# Patient Record
Sex: Female | Born: 1980 | Race: White | Hispanic: No | Marital: Married | Smoking: Former smoker
Health system: Southern US, Community
[De-identification: ages and names within clinical notes are randomized; demographics above are authoritative.]

## PROBLEM LIST (undated history)

## (undated) DIAGNOSIS — K219 Gastro-esophageal reflux disease without esophagitis: Secondary | ICD-10-CM

## (undated) DIAGNOSIS — R51 Headache: Secondary | ICD-10-CM

## (undated) DIAGNOSIS — F329 Major depressive disorder, single episode, unspecified: Secondary | ICD-10-CM

## (undated) DIAGNOSIS — E0590 Thyrotoxicosis, unspecified without thyrotoxic crisis or storm: Secondary | ICD-10-CM

## (undated) DIAGNOSIS — F419 Anxiety disorder, unspecified: Secondary | ICD-10-CM

## (undated) DIAGNOSIS — F32A Depression, unspecified: Secondary | ICD-10-CM

## (undated) HISTORY — DX: Thyrotoxicosis, unspecified without thyrotoxic crisis or storm: E05.90

## (undated) HISTORY — DX: Anxiety disorder, unspecified: F41.9

## (undated) HISTORY — PX: WISDOM TOOTH EXTRACTION: SHX21

## (undated) HISTORY — DX: Depression, unspecified: F32.A

## (undated) HISTORY — PX: TUBAL LIGATION: SHX77

## (undated) HISTORY — DX: Major depressive disorder, single episode, unspecified: F32.9

---
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
## 2003-06-21 ENCOUNTER — Other Ambulatory Visit: Admission: RE | Admit: 2003-06-21 | Discharge: 2003-06-21 | Payer: Self-pay | Admitting: Obstetrics and Gynecology

## 2003-11-05 ENCOUNTER — Emergency Department (HOSPITAL_COMMUNITY): Admission: EM | Admit: 2003-11-05 | Discharge: 2003-11-06 | Payer: Self-pay | Admitting: Emergency Medicine

## 2004-08-07 ENCOUNTER — Emergency Department (HOSPITAL_COMMUNITY): Admission: EM | Admit: 2004-08-07 | Discharge: 2004-08-07 | Payer: Self-pay | Admitting: Emergency Medicine

## 2006-11-06 ENCOUNTER — Inpatient Hospital Stay (HOSPITAL_COMMUNITY): Admission: AD | Admit: 2006-11-06 | Discharge: 2006-11-06 | Payer: Self-pay | Admitting: Obstetrics and Gynecology

## 2006-11-08 ENCOUNTER — Inpatient Hospital Stay (HOSPITAL_COMMUNITY): Admission: AD | Admit: 2006-11-08 | Discharge: 2006-11-08 | Payer: Self-pay | Admitting: Obstetrics and Gynecology

## 2006-11-13 ENCOUNTER — Inpatient Hospital Stay (HOSPITAL_COMMUNITY): Admission: AD | Admit: 2006-11-13 | Discharge: 2006-11-18 | Payer: Self-pay | Admitting: Obstetrics and Gynecology

## 2006-11-15 ENCOUNTER — Encounter (INDEPENDENT_AMBULATORY_CARE_PROVIDER_SITE_OTHER): Payer: Self-pay | Admitting: Obstetrics and Gynecology

## 2006-12-25 ENCOUNTER — Inpatient Hospital Stay (HOSPITAL_COMMUNITY): Admission: AD | Admit: 2006-12-25 | Discharge: 2006-12-25 | Payer: Self-pay | Admitting: Obstetrics and Gynecology

## 2007-02-19 ENCOUNTER — Emergency Department (HOSPITAL_COMMUNITY): Admission: EM | Admit: 2007-02-19 | Discharge: 2007-02-19 | Payer: Self-pay | Admitting: Emergency Medicine

## 2007-09-20 ENCOUNTER — Emergency Department (HOSPITAL_COMMUNITY): Admission: EM | Admit: 2007-09-20 | Discharge: 2007-09-20 | Payer: Self-pay | Admitting: Family Medicine

## 2007-10-15 ENCOUNTER — Emergency Department (HOSPITAL_COMMUNITY): Admission: EM | Admit: 2007-10-15 | Discharge: 2007-10-15 | Payer: Self-pay | Admitting: Emergency Medicine

## 2007-11-05 ENCOUNTER — Emergency Department (HOSPITAL_COMMUNITY): Admission: EM | Admit: 2007-11-05 | Discharge: 2007-11-06 | Payer: Self-pay | Admitting: Emergency Medicine

## 2010-06-04 NOTE — Discharge Summary (Signed)
Christina Riley, Christina Riley         ACCOUNT NO.:  [id number]   MEDICAL RECORD NO.:  [id number]          PATIENT TYPE:  INP   LOCATION:  9131                          FACILITY:  [facility]   PHYSICIAN:  Naima A. Dillard, M.D. DATE OF BIRTH:  [birth year]   DATE OF ADMISSION:  11/13/2006  DATE OF DISCHARGE:  11/18/2006                               DISCHARGE SUMMARY   ADMISSION DIAGNOSES:  1. Intrauterine pregnancy at term.  2. Gestational hypertension with no preeclampsia.  3. Unfavorable cervix.  4. Group B strep positive.  5. Cystic fibrosis carrier.   DISCHARGE DIAGNOSES:  1. Intrauterine pregnancy at 40-2/7 weeks.  2. Pregnancy-induced hypertension.  3. Failure to progress.   PROCEDURES:  1. Primary low transverse Cesarean section.  2. Epidural anesthesia.   HOSPITAL COURSE:  Christina Riley is a 30 year old, gravida 1, para 0 at  40 weeks who presented for evaluation of blood pressure on November 13, 2006. She had been having some elevated blood pressures but had a  negative PIH evaluation the week before. Her pregnancy had been  remarkable for:  1. First trimester spotting.  2. Toxoplasmosis risk.  3. Questionable last menstrual period.  4. First trimester urinary tract infection.  5. First trimester chlamydia.  6. Cystic fibrosis carrier.  7. Single umbilical artery.  8. Group B strep positive.  9. Gestational hypertension.   Her blood pressure in the office on October 24 was 150/100. She had some  protein in her clean-catch urine and was sent to maternity admissions  for evaluation. She had no protein on a catheter urine. LFTs were within  normal limits, and PIH labs were normal. Blood pressures were remaining  138/88 and 129/82. The patient was given the option of admission and  induction of labor. The other option was to go home on bedrest and  follow up in 48 hours. The patient and her mother decided upon  induction. Cervidil was placed on the evening of October 24.  She  received some Percocet during the night for back pain. By the morning of  October 25, cervix was 1, 50%, -2. Pitocin was begun. It was run  throughout the evening and was discontinued late in the afternoon with  no cervical change. She had some vaginal bleeding during the night. She  had some Cytotec. Cervix was 2, 60%, vertex -2. By the morning of  October 26, her cervix was 1 to 2, 70%, vertex at a -1 station. Uterine  contractions were every 2 to 5 minutes. They were more painful. Epidural  was placed. Artificial rupture of membranes was accomplished that  morning with clear fluid noted. IUPC was placed. By 5:00 in the  afternoon, she was still 2 cm, vertex not well applied. [place] had  been adequate. There was some occasional subtle decelerations.  Temperature was 99.6. Pitocin had to be stopped due to late  decelerations, and it was restarted with an overall reassuring tracing.  However, There was concern for having to increase the Pitocin and create  more decelerations. Decision was made to proceed with cesarean section.  The patient was taken to the operating room where  a primary low  transverse Cesarean section was performed by Dr. Estanislado Pandy. Findings were a  viable female by the name Christina Riley. Apgars were 6 and 9. Weight was 7  pounds. Infant was taken to the full-term nursery. Mother was taken to  recovery in good condition. By postoperative day #1, the patient was  doing well. She was up ad lib. Hemoglobin was 12.1, hematocrit 34.4,  white blood cell count 10.8 and platelet count of 224. The patient was  tolerating pain with p.o. pain medication without difficulty and a  regular diet. The rest of her hospital stay was uncomplicated. By  postoperative day #3, she was doing well. She was tolerated a regular  diet. She was working with a lactation consultation secondary to semi-  flat nipples and was still working on breast feeding. She was also doing  some pumping. She was  undecided regarding birth control and will decide  by six weeks. She was deemed to have received full benefit of hospital  setting and discharged home.   DISCHARGE INSTRUCTIONS:  Per [facility] handout.   DISCHARGE MEDICATIONS:  1. Motrin 600 mg p.o. q.6h. p.r.n. pain.  2. Percocet 1-2 p.o. q.3-4h. p.r.n. pain.   DISCHARGE FOLLOWUP:  Will occur in 6 weeks at [facility].      Christina Riley, C.N.M.      Naima A. Normand Sloop, M.D.  Electronically Signed    VLL/MEDQ  D:  11/18/2006  T:  11/18/2006  Job:  [id number]

## 2010-06-04 NOTE — Op Note (Signed)
Christina Riley, Christina Riley         ACCOUNT NO.:  [id number]   MEDICAL RECORD NO.:  [id number]          PATIENT TYPE:  INP   LOCATION:  9131                          [facility]ILITY[facility]  [facility]   PHYSICIAN:  Crist Fat. Rivard, M.D. DATE OF BIRTH:  05-23-1980   DATE OF PROCEDURE:  11/15/2006  DATE OF DISCHARGE:                               OPERATIVE REPORT   PREOPERATIVE DIAGNOSES:  1. Intrauterine pregnancy at 40 weeks and 2 days.  2. Pregnancy-induced hypertension.  3. Failure to progress.   POSTOPERATIVE DIAGNOSES:  1. Intrauterine pregnancy at 40 weeks and 2 days.  2. Pregnancy-induced hypertension.  3. Failure to progress.   ANESTHESIA:  Epidural, Dr. Jean Rosenthal.   PROCEDURE:  Primary low transverse cesarean section.   SURGEON:  Dr. Estanislado Pandy.   ASSISTANT:  Nigel Bridgeman, certified nurse midwife.   ESTIMATED BLOOD LOSS:  600 mL.   DESCRIPTION OF PROCEDURE:  After being informed of the planned procedure  with possible complications including bleeding, infection, injury to  other organs, informed consent was obtained.  The patient is taken to OR  #1 and preexisting epidural is reinforced.  She is placed in the dorsal  decubitus position, pelvis tilted to the left, prepped and draped in a  sterile fashion with a Foley catheter in her bladder.   After assessing adequate level of anesthesia, we infiltrate the  suprapubic area with 20 mL of Marcaine 0.25 and perform a Pfannenstiel  incision which was brought down sharply to the fascia.  The fascia is  incised in a low transverse fashion.  The linea alba is dissected,  peritoneum is entered in a midline fashion.  Alexis retractor is  inserted, visceral peritoneum is entered in a low transverse fashion  allowing [place] to safely retract bladder by developing a bladder flap.  We  can now enter the myometrium in a low transverse fashion first with  knife and then extended bluntly.  Amniotic fluid is thick meconium and  we assist the birth of a female  infant at 75, mouth and nose are  suctioned with DeLee suction, baby is delivered, cord is clamped with  two Kelly clamps and the baby is given to Dr. Joana Reamer present in the  room.  2 grams of Ancef IV are given to the patient and we wait for the  spontaneous delivery of the placenta.  It is complete, cord has three  vessels.  Uterine revision is negative.   We then proceed with closure of the myometrium in two layers, first with  a running locked suture of #0 Vicryl and then with a Lembert suture of  #0 Vicryl imbricating the first one.  Hemostasis was then completed on  the peritoneal edges with cauterization.  Both paracolic gutters are  cleaned, both tubes and ovaries assessed and normal and the pelvis is  then profusely irrigated with warm saline.  Hemostasis is deemed  adequate.   The retractors are removed, under fascia hemostasis is completed with  cautery and the fascia is closed with two running sutures of #1 Vicryl  meeting midline.  The wound is irrigated with warm saline.  Hemostasis  is completed  with cautery and the skin is closed with a subcuticular  suture of 3-0 Monocryl and Steri-Strips.   Instrument and sponge count is complete x2.  Estimated blood loss is 600  mL. The procedure is well tolerated by the patient who was taken to the  recovery room in a well and stable condition.   A little boy named Sheria Lang was born at 6:09 p.m., received an Apgar of 6  at 1 minute, 9 at 5 minutes and weighed 7 pounds.   SPECIMEN:  Placenta sent to pathology.      Crist Fat Rivard, M.D.  Electronically Signed     SAR/MEDQ  D:  11/15/2006  T:  11/16/2006  Job:  [id number]

## 2010-06-04 NOTE — H&P (Signed)
Riley, Christina Riley         ACCOUNT NO.:  [id number]   MEDICAL RECORD NO.:  [id number]          PATIENT TYPE:  INP   LOCATION:  9172                          [facility]   PHYSICIAN:  Crist Fat. Rivard, M.D. DATE OF BIRTH:  1980/08/01   DATE OF ADMISSION:  11/13/2006  DATE OF DISCHARGE:                              HISTORY & PHYSICAL   [name] is a 30 year old single white female, primigravida at 75-  0/7 weeks today, who presents from the office for evaluation of elevated  blood pressures.  She had a negative PIH evaluation last week, including  a 24 hour urine.  She denies signs and symptoms of PIH now.  She denies  bleeding or leaking.  Her pregnancy has been followed by the [facility] Certified Nurse Midwife [facility] and has been remarkable  for:  (1) First trimester spotting.  (2) Toxoplasmosis risk.  (3)  Questionable last menstrual period.  (4) First trimester UTI.  (5) First  trimester Chlamydia.  (6) Cystic fibrosis carrier.  (7) Single umbilical  artery.  (8) Group B strep positive.   Her prenatal labs were collected on April 08, 2006.  Hemoglobin 14.6,  hematocrit 42, platelets 294,000, blood type A positive, antibody  negative, toxoplasmosis labs negative, RPR nonreactive, rubella immune,  hepatitis B surface antigen negative, HIV nonreactive.  Pap smear from  December 2007 was within normal limits, gonorrhea negative, Chlamydia  positive, cystic fibrosis positive, and urine culture positive for E.  coli.  First trimester screen from May 06, 2006, was normal.  One hour  Glucola from August 10, 2006, was 102.  Culture in the vaginal tract for  group B strep on October 20, 2006, was positive.   HISTORY OF PRESENT PREGNANCY:  The patient presented for care at [facility] on April 08, 2006, at 8-5/7 weeks' gestation.  Ultrasonography  was done at that first visit, establishing best EDC to be November 13, 2006.  There was an anterior  previa noted at that visit.  She was also  treated for a UTI at that visit as well.  The patient had a normal first  trimester screen at 12-1/2 weeks' gestation.  Previa was still present  at that visit.  Chlamydia test of cure was negative at 18 weeks'  gestation.  She also had a negative urine culture at that visit as well.  Anatomy ultrasound at 20 weeks shows growth consistent with previous  dating.  There was single umbilical artery documented.  Previa had  resolved.  There was normal fluid.  She had an AFP that was within  normal limits.  She had a normal 1 hour Glucola.  Ultrasound for growth  at 28 weeks showed growth consistent with the previous dating, normal  fluid.  Growth ultrasound at 37 weeks was done due to single umbilical  artery.  Estimated fetal weight was at the 68th percentile with normal  fluid.  At her 39 week visit, blood pressure was elevated.  PIH workup  was within normal limits.  A 24-hour urine was normal.  At her visit in  the office today, blood  pressure was elevated to 150/100, and she had  some protein in her clean-catch urinalysis and was sent to maternity  admissions for evaluation.   OBSTETRICAL HISTORY:  She is a primigravida.   PAST MEDICAL HISTORY:  1. She has no medication allergies.  2. She experienced menarche at the age of 30 with 30 day cycles      lasting 3-4 days.  3. She reports having had the usual childhood illnesses.  4. She had a UTI at the age of 76.   SURGICAL HISTORY:  Negative.   FAMILY MEDICAL HISTORY:  Multiple family members with chronic  hypertension, maternal grandmother with diabetes, maternal grandmother  also with thyroid dysfunction.   GENETIC HISTORY:  The father of the baby's first cousin is expecting  twins.  Otherwise it is negative.   SOCIAL HISTORY:  The patient is single.  The father of the baby is  involved.  His name is Jeannett Senior.  They are both high school educated.  The patient is part-time employed at [employer].  Father of the  baby has a full time job.  They deny any alcohol, tobacco, or illicit  drug use since positive pregnancy test.   OBJECTIVE DATA:  Blood pressure is 138/88, 129/82.  Other vital signs  are stable.  She is afebrile.  HEENT:  Grossly within normal limits.  CHEST:  Clear to auscultation.  HEART:  Regular rate and rhythm.  ABDOMEN:  Gravid in contour with fundal height extending approximately  40 cm above the pubic symphysis.  NFT is reactive, mild contractions per  toco.  Cervix was fingertip, 50%, vertex -2 in the office today.  EXTREMITIES:  Remarkable for 1+ edema, 2+ DTRs.   Cath urinalysis has 100 of glucose and no protein, hemoglobin 14.4,  hematocrit 41.5.  Liver functions are within normal limits.  Platelets  288,000, LDH 76, and uric acid 3.8.   ASSESSMENT:  1. Intrauterine pregnancy at term.  2. Gestational hypertension with no preeclampsia.  3. Unfavorable cervix.   PLAN:  Options were reviewed with the patient.  Admission and induction  of labor were recommended with increased risk of cesarean section, fetal  distress, and a long induction process are reviewed with the patient.  It was also offered for her to go home on bed rest with followup in 48  hours.  The patient and her mother decided upon admission and induction.  We will plan on Cervidil overnight and then further plan to be  determined by cervical evaluation in the morning.      Cam Hai, C.N.M.      Crist Fat Rivard, M.D.  Electronically Signed    KS/MEDQ  D:  11/13/2006  T:  11/14/2006  Job:  [id number]

## 2010-10-10 LAB — CBC
Hemoglobin: 14.4
Platelets: 331
RDW: 13.3
WBC: 5.4

## 2010-10-10 LAB — COMPREHENSIVE METABOLIC PANEL
ALT: 27
AST: 19
Albumin: 4.2
Calcium: 9.1
Chloride: 107
Creatinine, Ser: 0.78
GFR calc Af Amer: >60
Glucose, Bld: 110 — ABNORMAL HIGH
Potassium: 4.3
Sodium: 141
Total Bilirubin: 0.6
Total Protein: 7.2

## 2010-10-10 LAB — RAPID URINE DRUG SCREEN, HOSP PERFORMED
Cocaine: POSITIVE — AB
Opiates: NOT DETECTED

## 2010-10-10 LAB — URINALYSIS, ROUTINE W REFLEX MICROSCOPIC
Hgb urine dipstick: NEGATIVE
Nitrite: NEGATIVE
pH: 6

## 2010-10-10 LAB — DIFFERENTIAL
Basophils Relative: 0
Eosinophils Absolute: 0
Neutro Abs: 2.8
Neutrophils Relative %: 51

## 2010-10-10 LAB — PREGNANCY, URINE: Preg Test, Ur: NEGATIVE

## 2010-10-21 LAB — URINE MICROSCOPIC-ADD ON

## 2010-10-21 LAB — URINALYSIS, ROUTINE W REFLEX MICROSCOPIC
Glucose, UA: NEGATIVE
Ketones, ur: NEGATIVE
Nitrite: POSITIVE — AB
Specific Gravity, Urine: 1.018
Urobilinogen, UA: 0.2

## 2010-10-21 LAB — DIFFERENTIAL
Basophils Absolute: 0
Basophils Relative: 0
Eosinophils Absolute: 0.1
Lymphocytes Relative: 40
Neutrophils Relative %: 50

## 2010-10-21 LAB — CBC
Hemoglobin: 12.4
MCHC: 33.6
MCV: 85.5
Platelets: 257
RBC: 4.3
WBC: 4.1

## 2010-10-21 LAB — POCT I-STAT, CHEM 8
BUN: 11
Creatinine, Ser: 0.6
Glucose, Bld: 82
TCO2: 25

## 2010-10-22 LAB — URINALYSIS, ROUTINE W REFLEX MICROSCOPIC
Bilirubin Urine: NEGATIVE
Glucose, UA: NEGATIVE
Protein, ur: 30 — AB
pH: 5.5

## 2010-10-22 LAB — POCT I-STAT, CHEM 8
BUN: 21
Chloride: 109
Creatinine, Ser: 0.5
Glucose, Bld: 91
HCT: 45
Hemoglobin: 15.3 — ABNORMAL HIGH
Potassium: 4.3
Sodium: 140

## 2010-10-22 LAB — POCT PREGNANCY, URINE: Preg Test, Ur: NEGATIVE

## 2010-10-22 LAB — URINE MICROSCOPIC-ADD ON

## 2010-10-28 LAB — CBC
MCHC: 35.4
MCV: 87.9
Platelets: 233
RBC: 4.3

## 2010-10-28 LAB — LACTATE DEHYDROGENASE: LDH: 83 — ABNORMAL LOW

## 2010-10-28 LAB — URINE MICROSCOPIC-ADD ON

## 2010-10-28 LAB — COMPREHENSIVE METABOLIC PANEL
AST: 33
Alkaline Phosphatase: 61
CO2: 25
GFR calc Af Amer: >60
Glucose, Bld: 99
Sodium: 140
Total Bilirubin: 0.6

## 2010-10-28 LAB — URINALYSIS, ROUTINE W REFLEX MICROSCOPIC
Ketones, ur: NEGATIVE
pH: 6

## 2010-10-28 LAB — URIC ACID: Uric Acid, Serum: 4.3

## 2010-10-30 LAB — URINALYSIS, ROUTINE W REFLEX MICROSCOPIC
Bilirubin Urine: NEGATIVE
Bilirubin Urine: NEGATIVE
Hgb urine dipstick: NEGATIVE
Ketones, ur: NEGATIVE
Nitrite: NEGATIVE
Protein, ur: NEGATIVE
Specific Gravity, Urine: 1.025
Urobilinogen, UA: 0.2
pH: 6

## 2010-10-30 LAB — COMPREHENSIVE METABOLIC PANEL
Albumin: 2.9 — ABNORMAL LOW
Albumin: 3 — ABNORMAL LOW
Alkaline Phosphatase: 163 — ABNORMAL HIGH
BUN: 10
BUN: 7
CO2: 22
Calcium: 9.5
Chloride: 108
GFR calc non Af Amer: >60
Glucose, Bld: 105 — ABNORMAL HIGH
Glucose, Bld: 90
Potassium: 4.2
Sodium: 136
Total Bilirubin: 0.5
Total Protein: 6.4

## 2010-10-30 LAB — CBC
HCT: 39
HCT: 41.5
Hemoglobin: 13.6
Hemoglobin: 13.6
Hemoglobin: 14.4
MCHC: 34.9
MCHC: 35.1
MCHC: 35.3
MCV: 90.2
MCV: 90.3
Platelets: 224
Platelets: 285
RBC: 4.27
RBC: 4.55
RDW: 13.8
WBC: 10.8 — ABNORMAL HIGH
WBC: 8.4

## 2010-10-30 LAB — LACTATE DEHYDROGENASE
LDH: 76 — ABNORMAL LOW
LDH: 96

## 2010-10-30 LAB — CREATININE CLEARANCE, URINE, 24 HOUR
Collection Interval-CRCL: 24
Creatinine, Urine: 51

## 2010-10-30 LAB — URIC ACID
Uric Acid, Serum: 3.7
Uric Acid, Serum: 3.8

## 2010-10-30 LAB — PROTEIN, URINE, 24 HOUR: Protein, 24H Urine: 113 — ABNORMAL HIGH

## 2011-01-21 NOTE — L&D Delivery Note (Signed)
Cesarean Section Procedure Note  Indications: Elective repeat C/S and BTL.  Pre-operative Diagnosis: 39 week 0 day pregnancy.  Post-operative Diagnosis: same  Surgeon: Coral Ceo A   Assistants: Antionette Char  Anesthesia: Spinal anesthesia  ASA Class: 2   Procedure Details   The patient was seen in the Holding Room. The risks, benefits, complications, treatment options, and expected outcomes were discussed with the patient.  The patient concurred with the proposed plan, giving informed consent.  The site of surgery properly noted/marked. The patient was taken to Operating Room # 9, identified as AMELA HANDLEY and the procedure verified as C-Section Delivery. A Time Out was held and the above information confirmed.  After induction of anesthesia, the patient was draped and prepped in the usual sterile manner. A Pfannenstiel incision was made and carried down through the subcutaneous tissue to the fascia. Fascial incision was made and extended transversely. The fascia was separated from the underlying rectus tissue superiorly and inferiorly. The peritoneum was identified and entered. Peritoneal incision was extended longitudinally. The utero-vesical peritoneal reflection was incised transversely and the bladder flap was bluntly freed from the lower uterine segment. A low transverse uterine incision was made. Delivered from cephalic presentation was a 2870 gram Female with Apgar scores of 9 at one minute and 9 at five minutes. After the umbilical cord was clamped and cut cord blood was obtained for evaluation. The placenta was removed intact and appeared normal. The uterine outline, tubes and ovaries appeared normal. The uterine incision was closed with running locked sutures of . Hemostasis was observed.  The left fallopian tube was then grasped with a Babcock clamp in isthmic portion and knuckle of tube beneath the clamp was suture ligated through the mesosalpinx.  The  knuckle of tube above the knot was then excised and submitted to pathology.  The tubal stumps were hemostatic and were placed back in their normal anatomic position.  The same procedure was performed on the opposite side without complications.  Lavage was carried out until clear. The fascia was then reapproximated with running sutures of 0 Vicryl. The skin was reapproximated with Staples.  Instrument, sponge, and needle counts were correct prior the abdominal closure and at the conclusion of the case.   Findings: Normal uterus, ovaries and tubes.  Estimated Blood Loss:  600         Drains: Foley         Total IV Fluids:          Specimens: Placenta          Implants: none         Complications:  None; patient tolerated the procedure well.         Disposition: PACU - hemodynamically stable.         Condition: stable  Attending Attestation: I was present and scrubbed for the entire procedure.

## 2011-04-30 LAB — OB RESULTS CONSOLE RPR: RPR: NONREACTIVE

## 2011-11-13 ENCOUNTER — Encounter (HOSPITAL_COMMUNITY): Payer: Self-pay | Admitting: Pharmacist

## 2011-11-13 ENCOUNTER — Other Ambulatory Visit: Payer: Self-pay | Admitting: Obstetrics

## 2011-11-13 ENCOUNTER — Encounter (HOSPITAL_COMMUNITY): Payer: Self-pay

## 2011-11-14 ENCOUNTER — Encounter (HOSPITAL_COMMUNITY): Payer: Self-pay

## 2011-11-14 ENCOUNTER — Encounter (HOSPITAL_COMMUNITY)
Admission: RE | Admit: 2011-11-14 | Discharge: 2011-11-14 | Disposition: A | Discharge status: Home / Self Care | Payer: Medicaid Other | Source: Ambulatory Visit | Attending: Obstetrics | Admitting: Obstetrics

## 2011-11-14 HISTORY — DX: Gastro-esophageal reflux disease without esophagitis: K21.9

## 2011-11-14 HISTORY — DX: Headache: R51

## 2011-11-14 LAB — RPR: RPR Ser Ql: NONREACTIVE

## 2011-11-14 LAB — CBC
Hemoglobin: 12.2 g/dL (ref 12.0–15.0)
MCHC: 33.5 g/dL (ref 30.0–36.0)
Platelets: 203 10*3/uL (ref 150–400)
RBC: 4.14 MIL/uL (ref 3.87–5.11)

## 2011-11-14 LAB — ABO/RH: ABO/RH(D): A POS

## 2011-11-14 LAB — TYPE AND SCREEN
ABO/RH(D): A POS
Antibody Screen: NEGATIVE

## 2011-11-14 LAB — SURGICAL PCR SCREEN: MRSA, PCR: NEGATIVE

## 2011-11-14 NOTE — Patient Instructions (Addendum)
   Your procedure is scheduled ZO:[id number] October 28th  Enter through the Main Entrance of [facility] at:6am Pick up the phone at the desk and dial [phone] and inform us of your arrival.  Please call this number if you have any problems the morning of surgery: [phone]  Do not eat or drink anything after midnight on Sunday Please take your protonix if needed with sips of water morning of surgery  Do not wear jewelry, make-up, or FINGER nail polish No metal in your hair or on your body. Do not wear lotions, powders, perfumes. You may wear deodorant.  Please use your CHG wash as directed prior to surgery.  Do not shave anywhere for at least 12 hours prior to first CHG shower.  Do not bring valuables to the hospital.   Leave suitcase in the car. After Surgery it may be brought to your room. For patients being admitted to the hospital, checkout time is 11:00am the day of discharge.

## 2011-11-17 ENCOUNTER — Inpatient Hospital Stay (HOSPITAL_COMMUNITY): Payer: Medicaid Other | Admitting: Anesthesiology

## 2011-11-17 ENCOUNTER — Encounter (HOSPITAL_COMMUNITY)
Admission: AD | Disposition: A | Discharge status: Home / Self Care | Payer: Self-pay | Source: Ambulatory Visit | Attending: Obstetrics

## 2011-11-17 ENCOUNTER — Encounter (HOSPITAL_COMMUNITY): Payer: Self-pay | Admitting: Anesthesiology

## 2011-11-17 ENCOUNTER — Encounter (HOSPITAL_COMMUNITY): Payer: Self-pay | Admitting: Obstetrics

## 2011-11-17 ENCOUNTER — Encounter (HOSPITAL_COMMUNITY): Payer: Self-pay

## 2011-11-17 ENCOUNTER — Inpatient Hospital Stay (HOSPITAL_COMMUNITY)
Admission: AD | Admit: 2011-11-17 | Discharge: 2011-11-19 | DRG: 766 | Disposition: A | Discharge status: Home / Self Care | Payer: Medicaid Other | Source: Ambulatory Visit | Attending: Obstetrics | Admitting: Obstetrics

## 2011-11-17 DIAGNOSIS — Z01812 Encounter for preprocedural laboratory examination: Secondary | ICD-10-CM

## 2011-11-17 DIAGNOSIS — Z98891 History of uterine scar from previous surgery: Secondary | ICD-10-CM

## 2011-11-17 DIAGNOSIS — Z302 Encounter for sterilization: Secondary | ICD-10-CM

## 2011-11-17 DIAGNOSIS — O34219 Maternal care for unspecified type scar from previous cesarean delivery: Principal | ICD-10-CM | POA: Diagnosis present

## 2011-11-17 DIAGNOSIS — Z01818 Encounter for other preprocedural examination: Secondary | ICD-10-CM

## 2011-11-17 LAB — PREPARE RBC (CROSSMATCH)

## 2011-11-17 SURGERY — Surgical Case
Anesthesia: Spinal | Site: Abdomen | Laterality: Bilateral | Wound class: Clean Contaminated

## 2011-11-17 MED ORDER — KETOROLAC TROMETHAMINE 30 MG/ML IJ SOLN: 30.0000 mg | Freq: Four times a day (QID) | INTRAMUSCULAR | Status: DC | PRN

## 2011-11-17 MED ORDER — OXYCODONE-ACETAMINOPHEN 5-325 MG PO TABS
1.0000 | ORAL_TABLET | ORAL | Status: DC | PRN
  Administered 2011-11-17 – 2011-11-19 (×9): 2 via ORAL
  Filled 2011-11-17 (×9): qty 2

## 2011-11-17 MED ORDER — SCOPOLAMINE 1 MG/3DAYS TD PT72
1.0000 | MEDICATED_PATCH | TRANSDERMAL | Status: DC
  Administered 2011-11-17: 1.5 mg via TRANSDERMAL

## 2011-11-17 MED ORDER — FENTANYL CITRATE 0.05 MG/ML IJ SOLN
INTRAMUSCULAR | Status: AC
  Filled 2011-11-17: qty 2

## 2011-11-17 MED ORDER — DIBUCAINE 1 % RE OINT: 1.0000 "application " | TOPICAL_OINTMENT | RECTAL | Status: DC | PRN

## 2011-11-17 MED ORDER — LACTATED RINGERS IV SOLN
INTRAVENOUS | Status: DC
Start: 2011-11-17 — End: 2011-11-19
  Administered 2011-11-17 (×2): via INTRAVENOUS

## 2011-11-17 MED ORDER — SIMETHICONE 80 MG PO CHEW
80.0000 mg | CHEWABLE_TABLET | Freq: Three times a day (TID) | ORAL | Status: DC
  Administered 2011-11-17 – 2011-11-18 (×6): 80 mg via ORAL

## 2011-11-17 MED ORDER — OXYTOCIN 40 UNITS IN LACTATED RINGERS INFUSION - SIMPLE MED
INTRAVENOUS | Status: DC | PRN
  Administered 2011-11-17: 40 [IU] via INTRAVENOUS

## 2011-11-17 MED ORDER — CEFAZOLIN SODIUM-DEXTROSE 2-3 GM-% IV SOLR
2.0000 g | INTRAVENOUS | Status: AC
  Administered 2011-11-17: 2 g via INTRAVENOUS

## 2011-11-17 MED ORDER — SCOPOLAMINE 1 MG/3DAYS TD PT72
1.0000 | MEDICATED_PATCH | Freq: Once | TRANSDERMAL | Status: DC
  Filled 2011-11-17: qty 1

## 2011-11-17 MED ORDER — SODIUM CHLORIDE 0.9 % IJ SOLN: 3.0000 mL | INTRAMUSCULAR | Status: DC | PRN

## 2011-11-17 MED ORDER — ZOLPIDEM TARTRATE 5 MG PO TABS: 5.0000 mg | ORAL_TABLET | Freq: Every evening | ORAL | Status: DC | PRN

## 2011-11-17 MED ORDER — SIMETHICONE 80 MG PO CHEW: 80.0000 mg | CHEWABLE_TABLET | ORAL | Status: DC | PRN

## 2011-11-17 MED ORDER — ACETAMINOPHEN 500 MG PO TABS
1000.0000 mg | ORAL_TABLET | Freq: Once | ORAL | Status: AC
  Administered 2011-11-17: 1000 mg via ORAL
  Filled 2011-11-17: qty 2

## 2011-11-17 MED ORDER — KETOROLAC TROMETHAMINE 60 MG/2ML IM SOLN
INTRAMUSCULAR | Status: AC
  Administered 2011-11-17: 60 mg via INTRAMUSCULAR
  Filled 2011-11-17: qty 2

## 2011-11-17 MED ORDER — FENTANYL CITRATE 0.05 MG/ML IJ SOLN
INTRAMUSCULAR | Status: DC | PRN
  Administered 2011-11-17: 25 ug via INTRATHECAL

## 2011-11-17 MED ORDER — SENNOSIDES-DOCUSATE SODIUM 8.6-50 MG PO TABS
2.0000 | ORAL_TABLET | Freq: Every day | ORAL | Status: DC
  Administered 2011-11-17 – 2011-11-18 (×2): 2 via ORAL

## 2011-11-17 MED ORDER — OXYTOCIN 10 UNIT/ML IJ SOLN
INTRAMUSCULAR | Status: AC
  Filled 2011-11-17: qty 4

## 2011-11-17 MED ORDER — EPHEDRINE 5 MG/ML INJ
INTRAVENOUS | Status: AC
  Filled 2011-11-17: qty 10

## 2011-11-17 MED ORDER — NALBUPHINE HCL 10 MG/ML IJ SOLN
5.0000 mg | INTRAMUSCULAR | Status: DC | PRN
  Filled 2011-11-17: qty 1

## 2011-11-17 MED ORDER — MORPHINE SULFATE 0.5 MG/ML IJ SOLN
INTRAMUSCULAR | Status: AC
  Filled 2011-11-17: qty 10

## 2011-11-17 MED ORDER — MEPERIDINE HCL 25 MG/ML IJ SOLN: 6.2500 mg | INTRAMUSCULAR | Status: DC | PRN

## 2011-11-17 MED ORDER — LACTATED RINGERS IV SOLN
INTRAVENOUS | Status: DC | PRN
  Administered 2011-11-17 (×3): via INTRAVENOUS

## 2011-11-17 MED ORDER — DIPHENHYDRAMINE HCL 25 MG PO CAPS: 25.0000 mg | ORAL_CAPSULE | ORAL | Status: DC | PRN

## 2011-11-17 MED ORDER — ONDANSETRON HCL 4 MG/2ML IJ SOLN
INTRAMUSCULAR | Status: AC
  Filled 2011-11-17: qty 2

## 2011-11-17 MED ORDER — TETANUS-DIPHTH-ACELL PERTUSSIS 5-2.5-18.5 LF-MCG/0.5 IM SUSP
0.5000 mL | Freq: Once | INTRAMUSCULAR | Status: AC
  Administered 2011-11-18: 0.5 mL via INTRAMUSCULAR
  Filled 2011-11-17: qty 0.5

## 2011-11-17 MED ORDER — OXYTOCIN 40 UNITS IN LACTATED RINGERS INFUSION - SIMPLE MED: 62.5000 mL/h | INTRAVENOUS | Status: AC

## 2011-11-17 MED ORDER — LANOLIN HYDROUS EX OINT: 1.0000 "application " | TOPICAL_OINTMENT | CUTANEOUS | Status: DC | PRN

## 2011-11-17 MED ORDER — HYDROMORPHONE HCL PF 1 MG/ML IJ SOLN
0.2500 mg | INTRAMUSCULAR | Status: DC | PRN
  Administered 2011-11-17 (×3): 0.5 mg via INTRAVENOUS

## 2011-11-17 MED ORDER — PRENATAL MULTIVITAMIN CH
1.0000 | ORAL_TABLET | Freq: Every day | ORAL | Status: DC
  Administered 2011-11-18 – 2011-11-19 (×2): 1 via ORAL
  Filled 2011-11-17 (×2): qty 1

## 2011-11-17 MED ORDER — CEFAZOLIN SODIUM-DEXTROSE 2-3 GM-% IV SOLR
INTRAVENOUS | Status: AC
  Filled 2011-11-17: qty 50

## 2011-11-17 MED ORDER — PHENYLEPHRINE HCL 10 MG/ML IJ SOLN
INTRAMUSCULAR | Status: DC | PRN
Start: 2011-11-17 — End: 2011-11-17
  Administered 2011-11-17: 80 ug via INTRAVENOUS

## 2011-11-17 MED ORDER — EPHEDRINE SULFATE 50 MG/ML IJ SOLN
INTRAMUSCULAR | Status: DC | PRN
  Administered 2011-11-17: 5 mg via INTRAVENOUS

## 2011-11-17 MED ORDER — METOCLOPRAMIDE HCL 5 MG/ML IJ SOLN: 10.0000 mg | Freq: Three times a day (TID) | INTRAMUSCULAR | Status: DC | PRN

## 2011-11-17 MED ORDER — DIPHENHYDRAMINE HCL 25 MG PO CAPS: 25.0000 mg | ORAL_CAPSULE | Freq: Four times a day (QID) | ORAL | Status: DC | PRN

## 2011-11-17 MED ORDER — ONDANSETRON HCL 4 MG/2ML IJ SOLN
INTRAMUSCULAR | Status: DC | PRN
  Administered 2011-11-17: 4 mg via INTRAVENOUS

## 2011-11-17 MED ORDER — MENTHOL 3 MG MT LOZG: 1.0000 | LOZENGE | OROMUCOSAL | Status: DC | PRN

## 2011-11-17 MED ORDER — MORPHINE SULFATE (PF) 0.5 MG/ML IJ SOLN
INTRAMUSCULAR | Status: DC | PRN
  Administered 2011-11-17: .15 mg via INTRATHECAL

## 2011-11-17 MED ORDER — IBUPROFEN 600 MG PO TABS: 600.0000 mg | ORAL_TABLET | Freq: Four times a day (QID) | ORAL | Status: DC | PRN

## 2011-11-17 MED ORDER — WITCH HAZEL-GLYCERIN EX PADS: 1.0000 "application " | MEDICATED_PAD | CUTANEOUS | Status: DC | PRN

## 2011-11-17 MED ORDER — HYDROMORPHONE HCL PF 1 MG/ML IJ SOLN
INTRAMUSCULAR | Status: AC
  Administered 2011-11-17: 0.5 mg via INTRAVENOUS
  Filled 2011-11-17: qty 1

## 2011-11-17 MED ORDER — SODIUM CHLORIDE 0.9 % IV SOLN: 1.0000 ug/kg/h | INTRAVENOUS | Status: DC | PRN

## 2011-11-17 MED ORDER — OXYCODONE-ACETAMINOPHEN 5-325 MG PO TABS
1.0000 | ORAL_TABLET | Freq: Once | ORAL | Status: AC
  Administered 2011-11-17: 1 via ORAL
  Filled 2011-11-17: qty 1

## 2011-11-17 MED ORDER — SCOPOLAMINE 1 MG/3DAYS TD PT72
MEDICATED_PATCH | TRANSDERMAL | Status: AC
  Filled 2011-11-17: qty 1

## 2011-11-17 MED ORDER — DIPHENHYDRAMINE HCL 50 MG/ML IJ SOLN
12.5000 mg | INTRAMUSCULAR | Status: DC | PRN
  Administered 2011-11-17: 12.5 mg via INTRAVENOUS
  Filled 2011-11-17: qty 1

## 2011-11-17 MED ORDER — NALOXONE HCL 0.4 MG/ML IJ SOLN: 0.4000 mg | INTRAMUSCULAR | Status: DC | PRN

## 2011-11-17 MED ORDER — LACTATED RINGERS IV SOLN
INTRAVENOUS | Status: DC
  Administered 2011-11-17: 125 mL/h via INTRAVENOUS

## 2011-11-17 MED ORDER — ONDANSETRON HCL 4 MG/2ML IJ SOLN: 4.0000 mg | Freq: Three times a day (TID) | INTRAMUSCULAR | Status: DC | PRN

## 2011-11-17 MED ORDER — KETOROLAC TROMETHAMINE 60 MG/2ML IM SOLN
60.0000 mg | Freq: Once | INTRAMUSCULAR | Status: AC | PRN
  Administered 2011-11-17: 60 mg via INTRAMUSCULAR

## 2011-11-17 MED ORDER — ONDANSETRON HCL 4 MG/2ML IJ SOLN: 4.0000 mg | INTRAMUSCULAR | Status: DC | PRN

## 2011-11-17 MED ORDER — IBUPROFEN 600 MG PO TABS
600.0000 mg | ORAL_TABLET | Freq: Four times a day (QID) | ORAL | Status: DC
  Administered 2011-11-17 – 2011-11-19 (×8): 600 mg via ORAL
  Filled 2011-11-17 (×8): qty 1

## 2011-11-17 MED ORDER — DIPHENHYDRAMINE HCL 50 MG/ML IJ SOLN: 25.0000 mg | INTRAMUSCULAR | Status: DC | PRN

## 2011-11-17 MED ORDER — PHENYLEPHRINE 40 MCG/ML (10ML) SYRINGE FOR IV PUSH (FOR BLOOD PRESSURE SUPPORT)
PREFILLED_SYRINGE | INTRAVENOUS | Status: AC
  Filled 2011-11-17: qty 5

## 2011-11-17 MED ORDER — ONDANSETRON HCL 4 MG PO TABS
4.0000 mg | ORAL_TABLET | ORAL | Status: DC | PRN
  Administered 2011-11-18: 4 mg via ORAL
  Filled 2011-11-17: qty 1

## 2011-11-17 SURGICAL SUPPLY — 45 items
ADH SKN CLS APL DERMABOND .7 (GAUZE/BANDAGES/DRESSINGS)
BARRIER ADHS 3X4 INTERCEED (GAUZE/BANDAGES/DRESSINGS) ×1 IMPLANT
BRR ADH 4X3 ABS CNTRL BYND (GAUZE/BANDAGES/DRESSINGS) ×1
CANISTER WOUND CARE 500ML ATS (WOUND CARE) IMPLANT
CLOTH BEACON ORANGE TIMEOUT ST (SAFETY) ×2 IMPLANT
CONTAINER PREFILL 10% NBF 15ML (MISCELLANEOUS) ×4 IMPLANT
DERMABOND ADVANCED (GAUZE/BANDAGES/DRESSINGS)
DERMABOND ADVANCED .7 DNX12 (GAUZE/BANDAGES/DRESSINGS) ×1 IMPLANT
DRAPE SURG 17X23 STRL (DRAPES) ×2 IMPLANT
DRSG COVADERM 4X10 (GAUZE/BANDAGES/DRESSINGS) ×1 IMPLANT
DRSG VAC ATS LRG SENSATRAC (GAUZE/BANDAGES/DRESSINGS) IMPLANT
DRSG VAC ATS MED SENSATRAC (GAUZE/BANDAGES/DRESSINGS) IMPLANT
DRSG VAC ATS SM SENSATRAC (GAUZE/BANDAGES/DRESSINGS) IMPLANT
DURAPREP 26ML APPLICATOR (WOUND CARE) ×2 IMPLANT
ELECT REM PT RETURN 9FT ADLT (ELECTROSURGICAL) ×2
ELECTRODE REM PT RTRN 9FT ADLT (ELECTROSURGICAL) ×1 IMPLANT
EXTRACTOR VACUUM M CUP 4 TUBE (SUCTIONS) IMPLANT
GLOVE BIO SURGEON STRL SZ8 (GLOVE) ×4 IMPLANT
GOWN PREVENTION PLUS LG XLONG (DISPOSABLE) ×4 IMPLANT
GOWN PREVENTION PLUS XLARGE (GOWN DISPOSABLE) ×2 IMPLANT
KIT ABG SYR 3ML LUER SLIP (SYRINGE) IMPLANT
NDL HYPO 25X5/8 SAFETYGLIDE (NEEDLE) ×1 IMPLANT
NEEDLE HYPO 25X5/8 SAFETYGLIDE (NEEDLE) IMPLANT
NS IRRIG 1000ML POUR BTL (IV SOLUTION) ×2 IMPLANT
PACK C SECTION WH (CUSTOM PROCEDURE TRAY) ×2 IMPLANT
PAD OB MATERNITY 4.3X12.25 (PERSONAL CARE ITEMS) IMPLANT
RTRCTR C-SECT PINK 25CM LRG (MISCELLANEOUS) ×2 IMPLANT
SLEEVE SCD COMPRESS KNEE MED (MISCELLANEOUS) IMPLANT
STAPLER VISISTAT 35W (STAPLE) IMPLANT
SUT GUT PLAIN 0 CT-3 TAN 27 (SUTURE) ×2 IMPLANT
SUT MNCRL 0 VIOLET CTX 36 (SUTURE) ×3 IMPLANT
SUT MNCRL AB 4-0 PS2 18 (SUTURE) IMPLANT
SUT MON AB 2-0 CT1 27 (SUTURE) ×2 IMPLANT
SUT MON AB 3-0 SH 27 (SUTURE)
SUT MON AB 3-0 SH27 (SUTURE) IMPLANT
SUT MONOCRYL 0 CTX 36 (SUTURE) ×3
SUT PDS AB 0 CTX 60 (SUTURE) ×4 IMPLANT
SUT PLAIN 2 0 XLH (SUTURE) ×1 IMPLANT
SUT VIC AB 0 CTX 36 (SUTURE) ×2
SUT VIC AB 0 CTX36XBRD ANBCTRL (SUTURE) IMPLANT
SUT VIC AB 2-0 CT1 27 (SUTURE) ×2
SUT VIC AB 2-0 CT1 TAPERPNT 27 (SUTURE) IMPLANT
TOWEL OR 17X24 6PK STRL BLUE (TOWEL DISPOSABLE) ×4 IMPLANT
TRAY FOLEY CATH 14FR (SET/KITS/TRAYS/PACK) ×2 IMPLANT
WATER STERILE IRR 1000ML POUR (IV SOLUTION) ×1 IMPLANT

## 2011-11-17 NOTE — Anesthesia Preprocedure Evaluation (Addendum)

## 2011-11-17 NOTE — Transfer of Care (Signed)
Immediate Anesthesia Transfer of Care Note  Patient: Christina Riley  Procedure(s) Performed: Procedure(s) (LRB) with comments: CESAREAN SECTION WITH BILATERAL TUBAL LIGATION (Bilateral) - Repeat  Patient Location: PACU  Anesthesia Type:Spinal  Level of Consciousness: awake, alert  and oriented  Airway & Oxygen Therapy: Patient Spontanous Breathing  Post-op Assessment: Report given to PACU RN and Post -op Vital signs reviewed and stable  Post vital signs: Reviewed and stable  Complications: No apparent anesthesia complications

## 2011-11-17 NOTE — Anesthesia Procedure Notes (Signed)

## 2011-11-17 NOTE — H&P (Signed)
Christina Riley is a 31 y.o. female presenting for repeat C/S. Maternal Medical History:  Reason for admission: 31 yo G2 P1.  EDC 11-24-11.  Presents for elective repeat C/S.  Fetal activity: Perceived fetal activity is normal.   Last perceived fetal movement was within the past hour.    Prenatal complications: no prenatal complications Prenatal Complications - Diabetes: none.    OB History    Grav Para Term Preterm Abortions TAB SAB Ect Mult Living   2 1 1       1      Past Medical History  Diagnosis Date  . GERD (gastroesophageal reflux disease)     with pregnancy  . Headache     pregnancy   Past Surgical History  Procedure Date  . Cesarean section 2008  . Wisdom tooth extraction    Family History: family history is not on file. Social History:  reports that she quit smoking about a year ago. She does not have any smokeless tobacco history on file. She reports that she does not drink alcohol or use illicit drugs.   Prenatal Transfer Tool  Maternal Diabetes: No Genetic Screening: Normal Maternal Ultrasounds/Referrals: Normal Fetal Ultrasounds or other Referrals:  None Maternal Substance Abuse:  No Significant Maternal Medications:  Meds include: Other:  PNV Significant Maternal Lab Results:  None Other Comments:  None  Review of Systems  All other systems reviewed and are negative.      Blood pressure 136/79, pulse 93, temperature 98.4 F (36.9 C), temperature source Oral, resp. rate 16, height 5\' 4"  (1.626 m), weight 96.163 kg (212 lb), last menstrual period 02/25/2011, SpO2 98.00%. Maternal Exam:  Abdomen: Patient reports no abdominal tenderness. Introitus: not evaluated.   Cervix: not evaluated.   Physical Exam  Nursing note and vitals reviewed. Constitutional: She is oriented to person, place, and time. She appears well-developed and well-nourished.  HENT:  Head: Normocephalic and atraumatic.  Eyes: Conjunctivae normal are normal. Pupils are  equal, round, and reactive to light.  Neck: Normal range of motion. Neck supple.  Cardiovascular: Normal rate and regular rhythm.   Respiratory: Effort normal.  GI: Soft.  Musculoskeletal: Normal range of motion.  Neurological: She is alert and oriented to person, place, and time.  Skin: Skin is warm and dry.  Psychiatric: She has a normal mood and affect. Her behavior is normal. Judgment and thought content normal.    Prenatal labs: ABO, Rh: --/--/A POS, A POS (10/25 1225) Antibody: NEG (10/25 1225) Rubella: Immune (04/10 0000) RPR: NON REACTIVE (10/25 1225)  HBsAg: Negative (04/10 0000)  HIV: Non-reactive (04/10 0000)  GBS:     Assessment/Plan: 39 weeks.  Previous C/S.  Elective repeat C/S.   HARPER,CHARLES A 11/17/2011, 6:43 AM

## 2011-11-17 NOTE — Anesthesia Postprocedure Evaluation (Signed)
  Anesthesia Post-op Note  Patient: Christina Riley  Procedure(s) Performed: Procedure(s) (LRB) with comments: CESAREAN SECTION WITH BILATERAL TUBAL LIGATION (Bilateral) - Repeat   Patient is awake, responsive, moving her legs, and has signs of resolution of her numbness. Pain and nausea are reasonably well controlled. Vital signs are stable and clinically acceptable. Oxygen saturation is clinically acceptable. There are no apparent anesthetic complications at this time. Patient is ready for discharge.

## 2011-11-18 LAB — CBC
HCT: 31 % — ABNORMAL LOW (ref 36.0–46.0)
MCH: 29.7 pg (ref 26.0–34.0)
MCV: 88.6 fL (ref 78.0–100.0)
RBC: 3.5 MIL/uL — ABNORMAL LOW (ref 3.87–5.11)
WBC: 7.2 10*3/uL (ref 4.0–10.5)

## 2011-11-18 NOTE — Anesthesia Postprocedure Evaluation (Signed)
  Anesthesia Post-op Note  Patient: Christina Riley  Procedure(s) Performed: Procedure(s) (LRB) with comments: CESAREAN SECTION WITH BILATERAL TUBAL LIGATION (Bilateral) - Repeat  Patient Location: Mother/Baby  Anesthesia Type:Spinal  Level of Consciousness: awake, alert  and oriented  Airway and Oxygen Therapy: Patient Spontanous Breathing  Post-op Pain: none  Post-op Assessment: Post-op Vital signs reviewed and Patient's Cardiovascular Status Stable  Post-op Vital Signs: Reviewed and stable  Complications: No apparent anesthesia complications

## 2011-11-18 NOTE — Addendum Note (Signed)
Addendum  created 11/18/11 1143 by Shanon Payor, CRNA   Modules edited:Charges VN, Notes Section

## 2011-11-18 NOTE — Progress Notes (Signed)
UR Chart review completed.  

## 2011-11-18 NOTE — Progress Notes (Signed)
Subjective: Postpartum Day 1: Cesarean Delivery Patient reports incisional pain, tolerating PO, + flatus and no problems voiding.    Objective: Vital signs in last 24 hours: Temp:  [97.4 F (36.3 C)-98.5 F (36.9 C)] 98.5 F (36.9 C) (10/29 0820) Pulse Rate:  [61-79] 72  (10/29 0820) Resp:  [15-22] 16  (10/29 0500) BP: (98-141)/(61-78) 109/66 mmHg (10/29 0820) SpO2:  [96 %-100 %] 97 % (10/29 0820)  Physical Exam:  General: alert and no distress Lochia: appropriate Uterine Fundus: firm Incision: healing well DVT Evaluation: No evidence of DVT seen on physical exam.   Basename 11/18/11 0525  HGB 10.4*  HCT 31.0*    Assessment/Plan: Status post Cesarean section. Doing well postoperatively.  Continue current care.  Ann-Marie Kluge A 11/18/2011, 9:17 AM

## 2011-11-19 LAB — TYPE AND SCREEN: Unit division: 0

## 2011-11-19 MED ORDER — OXYCODONE-ACETAMINOPHEN 5-325 MG PO TABS: 1.0000 | ORAL_TABLET | ORAL | Status: DC | PRN

## 2011-11-19 MED ORDER — IBUPROFEN 600 MG PO TABS: 600.0000 mg | ORAL_TABLET | Freq: Four times a day (QID) | ORAL | Status: DC

## 2011-11-19 NOTE — Discharge Summary (Signed)
Obstetric Discharge Summary Reason for Admission: cesarean section and tubal ligation. Prenatal Procedures: ultrasound Intrapartum Procedures: cesarean: low cervical, transverse and tubal ligation Postpartum Procedures: none Complications-Operative and Postpartum: none Hemoglobin  Date Value Range Status  11/18/2011 10.4* 12.0 - 15.0 g/dL Final     HCT  Date Value Range Status  11/18/2011 31.0* 36.0 - 46.0 % Final    Physical Exam:  General: alert and no distress Lochia: appropriate Uterine Fundus: firm Incision: healing well DVT Evaluation: No evidence of DVT seen on physical exam.  Discharge Diagnoses: Term Pregnancy-delivered  Discharge Information: Date: 11/19/2011 Activity: pelvic rest Diet: routine Medications: PNV, Ibuprofen, Colace and Percocet Condition: stable Instructions: refer to practice specific booklet Discharge to: home Follow-up Information    Follow up with HARPER,CHARLES A, MD. Schedule an appointment as soon as possible for a visit in 6 weeks.   Contact information:   [address] [place] [address] [phone]          Newborn Data: Live born female  Birth Weight: 6 lb 5.2 oz (2870 g) APGAR: 9, 9  Home with mother.  HARPER,CHARLES A 11/19/2011, 9:04 AM

## 2011-11-19 NOTE — Progress Notes (Signed)
Subjective: Postpartum Day 2: Cesarean Delivery Patient reports incisional pain, tolerating PO, + flatus and no problems voiding.    Objective: Vital signs in last 24 hours: Temp:  [97 F (36.1 C)-97.8 F (36.6 C)] 97.8 F (36.6 C) (10/30 0537) Pulse Rate:  [56-68] 64  (10/30 0537) Resp:  [18] 18  (10/30 0537) BP: (107-121)/(70-79) 107/70 mmHg (10/30 0537)  Physical Exam:  General: alert and no distress Lochia: appropriate Uterine Fundus: firm Incision: healing well DVT Evaluation: No evidence of DVT seen on physical exam.   Basename 11/18/11 0525  HGB 10.4*  HCT 31.0*    Assessment/Plan: Status post Cesarean section. Doing well postoperatively.  Continue current care.  Kienna Moncada A 11/19/2011, 8:25 AM

## 2011-11-24 ENCOUNTER — Encounter (HOSPITAL_COMMUNITY): Payer: Self-pay | Admitting: *Deleted

## 2011-11-24 ENCOUNTER — Inpatient Hospital Stay (HOSPITAL_COMMUNITY)
Admission: AD | Admit: 2011-11-24 | Discharge: 2011-11-26 | DRG: 776 | Disposition: A | Discharge status: Home / Self Care | Payer: Medicaid Other | Source: Ambulatory Visit | Attending: Obstetrics & Gynecology | Admitting: Obstetrics & Gynecology

## 2011-11-24 DIAGNOSIS — IMO0002 Reserved for concepts with insufficient information to code with codable children: Principal | ICD-10-CM | POA: Diagnosis present

## 2011-11-24 DIAGNOSIS — O1490 Unspecified pre-eclampsia, unspecified trimester: Secondary | ICD-10-CM

## 2011-11-24 LAB — URINALYSIS, ROUTINE W REFLEX MICROSCOPIC
Ketones, ur: NEGATIVE mg/dL
Leukocytes, UA: NEGATIVE
Nitrite: NEGATIVE
pH: 6.5 (ref 5.0–8.0)

## 2011-11-24 LAB — COMPREHENSIVE METABOLIC PANEL
AST: 43 U/L — ABNORMAL HIGH (ref 0–37)
Albumin: 3.1 g/dL — ABNORMAL LOW (ref 3.5–5.2)
CO2: 27 mEq/L (ref 19–32)
Calcium: 9.9 mg/dL (ref 8.4–10.5)
Creatinine, Ser: 0.83 mg/dL (ref 0.50–1.10)
GFR calc non Af Amer: >90 mL/min (ref >90)
Total Protein: 7.4 g/dL (ref 6.0–8.3)

## 2011-11-24 LAB — PROTEIN / CREATININE RATIO, URINE: Protein Creatinine Ratio: 0.06 (ref 0.00–0.15)

## 2011-11-24 LAB — CBC WITH DIFFERENTIAL/PLATELET
Basophils Absolute: 0 10*3/uL (ref 0.0–0.1)
Basophils Relative: 0 % (ref 0–1)
Eosinophils Absolute: 0.1 10*3/uL (ref 0.0–0.7)
Eosinophils Relative: 2 % (ref 0–5)
HCT: 37 % (ref 36.0–46.0)
MCH: 29.9 pg (ref 26.0–34.0)
MCHC: 33.8 g/dL (ref 30.0–36.0)
MCV: 88.5 fL (ref 78.0–100.0)
Monocytes Absolute: 0.6 10*3/uL (ref 0.1–1.0)
Platelets: 307 10*3/uL (ref 150–400)
RDW: 13.6 % (ref 11.5–15.5)

## 2011-11-24 LAB — MRSA PCR SCREENING: MRSA by PCR: NEGATIVE

## 2011-11-24 LAB — LACTATE DEHYDROGENASE: LDH: 129 U/L (ref 94–250)

## 2011-11-24 MED ORDER — IBUPROFEN 600 MG PO TABS: 600.0000 mg | ORAL_TABLET | Freq: Four times a day (QID) | ORAL | Status: DC

## 2011-11-24 MED ORDER — LACTATED RINGERS IV SOLN
INTRAVENOUS | Status: DC
  Administered 2011-11-24 – 2011-11-26 (×4): via INTRAVENOUS

## 2011-11-24 MED ORDER — IBUPROFEN 600 MG PO TABS
600.0000 mg | ORAL_TABLET | Freq: Four times a day (QID) | ORAL | Status: DC
  Administered 2011-11-24 – 2011-11-26 (×8): 600 mg via ORAL
  Filled 2011-11-24 (×8): qty 1

## 2011-11-24 MED ORDER — MAGNESIUM SULFATE 40 G IN LACTATED RINGERS - SIMPLE
2.0000 g/h | INTRAVENOUS | Status: DC
  Administered 2011-11-24 – 2011-11-25 (×2): 2 g/h via INTRAVENOUS
  Filled 2011-11-24 (×2): qty 500

## 2011-11-24 MED ORDER — DIPHENHYDRAMINE HCL 25 MG PO CAPS
25.0000 mg | ORAL_CAPSULE | Freq: Four times a day (QID) | ORAL | Status: DC | PRN
  Filled 2011-11-24: qty 1

## 2011-11-24 MED ORDER — MENTHOL 3 MG MT LOZG: 1.0000 | LOZENGE | OROMUCOSAL | Status: DC | PRN

## 2011-11-24 MED ORDER — SIMETHICONE 80 MG PO CHEW
80.0000 mg | CHEWABLE_TABLET | Freq: Three times a day (TID) | ORAL | Status: DC
  Administered 2011-11-24 – 2011-11-26 (×6): 80 mg via ORAL

## 2011-11-24 MED ORDER — OXYCODONE-ACETAMINOPHEN 5-325 MG PO TABS
1.0000 | ORAL_TABLET | ORAL | Status: DC | PRN
  Administered 2011-11-24 – 2011-11-25 (×3): 1 via ORAL
  Filled 2011-11-24: qty 2
  Filled 2011-11-24 (×2): qty 1

## 2011-11-24 MED ORDER — PRENATAL MULTIVITAMIN CH: 1.0000 | ORAL_TABLET | Freq: Every day | ORAL | Status: DC

## 2011-11-24 MED ORDER — WITCH HAZEL-GLYCERIN EX PADS: 1.0000 "application " | MEDICATED_PAD | CUTANEOUS | Status: DC | PRN

## 2011-11-24 MED ORDER — OXYCODONE-ACETAMINOPHEN 5-325 MG PO TABS
1.0000 | ORAL_TABLET | Freq: Once | ORAL | Status: AC
  Administered 2011-11-24: 1 via ORAL
  Filled 2011-11-24: qty 1

## 2011-11-24 MED ORDER — ONDANSETRON HCL 4 MG/2ML IJ SOLN
4.0000 mg | INTRAMUSCULAR | Status: DC | PRN
  Administered 2011-11-25: 4 mg via INTRAVENOUS
  Filled 2011-11-24: qty 2

## 2011-11-24 MED ORDER — SENNOSIDES-DOCUSATE SODIUM 8.6-50 MG PO TABS
2.0000 | ORAL_TABLET | Freq: Every day | ORAL | Status: DC
  Administered 2011-11-24 – 2011-11-25 (×2): 2 via ORAL

## 2011-11-24 MED ORDER — OXYCODONE-ACETAMINOPHEN 5-325 MG PO TABS: 1.0000 | ORAL_TABLET | ORAL | Status: DC | PRN

## 2011-11-24 MED ORDER — PRENATAL MULTIVITAMIN CH
1.0000 | ORAL_TABLET | Freq: Every day | ORAL | Status: DC
  Administered 2011-11-25 – 2011-11-26 (×2): 1 via ORAL
  Filled 2011-11-24 (×2): qty 1

## 2011-11-24 MED ORDER — DIBUCAINE 1 % RE OINT: 1.0000 "application " | TOPICAL_OINTMENT | RECTAL | Status: DC | PRN

## 2011-11-24 MED ORDER — SIMETHICONE 80 MG PO CHEW: 80.0000 mg | CHEWABLE_TABLET | ORAL | Status: DC | PRN

## 2011-11-24 MED ORDER — MAGNESIUM SULFATE BOLUS VIA INFUSION
4.0000 g | Freq: Once | INTRAVENOUS | Status: AC
  Administered 2011-11-24: 4 g via INTRAVENOUS
  Filled 2011-11-24: qty 500

## 2011-11-24 MED ORDER — TETANUS-DIPHTH-ACELL PERTUSSIS 5-2.5-18.5 LF-MCG/0.5 IM SUSP: 0.5000 mL | Freq: Once | INTRAMUSCULAR | Status: DC

## 2011-11-24 MED ORDER — ONDANSETRON HCL 4 MG PO TABS: 4.0000 mg | ORAL_TABLET | ORAL | Status: DC | PRN

## 2011-11-24 MED ORDER — LANOLIN HYDROUS EX OINT: 1.0000 "application " | TOPICAL_OINTMENT | CUTANEOUS | Status: DC | PRN

## 2011-11-24 MED ORDER — BUTALBITAL-APAP-CAFFEINE 50-325-40 MG PO TABS
2.0000 | ORAL_TABLET | Freq: Four times a day (QID) | ORAL | Status: DC | PRN
  Administered 2011-11-25: 1 via ORAL
  Administered 2011-11-25 – 2011-11-26 (×3): 2 via ORAL
  Filled 2011-11-24: qty 2
  Filled 2011-11-24: qty 1
  Filled 2011-11-24 (×2): qty 2

## 2011-11-24 MED ORDER — ZOLPIDEM TARTRATE 5 MG PO TABS
5.0000 mg | ORAL_TABLET | Freq: Every evening | ORAL | Status: DC | PRN
  Administered 2011-11-24 – 2011-11-25 (×2): 5 mg via ORAL
  Filled 2011-11-24 (×2): qty 1

## 2011-11-24 MED ORDER — PANTOPRAZOLE SODIUM 40 MG PO TBEC: 40.0000 mg | DELAYED_RELEASE_TABLET | Freq: Every day | ORAL | Status: DC

## 2011-11-24 NOTE — Progress Notes (Signed)
4gm Magnesium bolus started

## 2011-11-24 NOTE — MAU Note (Signed)
Patient states she delivered by repeat cesarean section on 10-28. Several days after discharge started having an increase in lower extremities swelling and hands going numb. Has had a headache for several days that are unrelieved by Percocet. Seen in the office and sent to MAU for evaluation.

## 2011-11-24 NOTE — H&P (Signed)
  Subjective: POD# 6 s/p Cesarean Delivery.  Indications: Elective repeat.  RH status/Rubella reviewed. Feeding: breast Patient reports tolerating PO.  Denies HA/SOB/C/P/N/V/dizziness.  Reports flatus or BM. Breast symptoms: no.  She reports vaginal bleeding as normal, without clots.  She is ambulating, urinating without difficulty.  Has had HA for past 2 days, worse over past 24 hours.   Objective: Vital signs in last 24 hours: BP 151/80  Pulse 63  Temp 98.7 F (37.1 C) (Oral)  Resp 16  SpO2 100%  LMP 02/25/2011  Breastfeeding? Unknown       Physical Exam:  General: alert and no distress CV: Regular rate and rhythm Resp: clear Abdomen: soft, nontender, normal bowel sounds Lochia: minimal Uterine Fundus: firm, below umbilicus, nontender Incision: clean, dry and intact Ext: edema scant, pedal.  Labs:  Results for CHANDI, NICKLIN (MRN [id number]) as of 11/24/2011 16:58  Ref. Range 11/24/2011 15:14  Sodium Latest Range: 135-145 mEq/L 136  Potassium Latest Range: 3.5-5.1 mEq/L 4.4  Chloride Latest Range: 96-112 mEq/L 100  CO2 Latest Range: 19-32 mEq/L 27  BUN Latest Range: 6-23 mg/dL 15  Creatinine Latest Range: 0.50-1.10 mg/dL 1.19  Calcium Latest Range: 8.4-10.5 mg/dL 9.9  GFR calc non Af Amer Latest Range: >90 mL/min >90  GFR calc Af Amer Latest Range: >90 mL/min >90  Glucose Latest Range: 70-99 mg/dL 80  Alkaline Phosphatase Latest Range: 39-117 U/L 135 (H)  Albumin Latest Range: 3.5-5.2 g/dL 3.1 (L)  Uric Acid, Serum Latest Range: 2.4-7.0 mg/dL 6.1  AST Latest Range: 0-37 U/L 43 (H)  ALT Latest Range: 0-35 U/L 92 (H)  Total Protein Latest Range: 6.0-8.3 g/dL 7.4  Total Bilirubin Latest Range: 0.3-1.2 mg/dL 0.3    Basename 14/78/29 1514  HGB 12.5  HCT 37.0      Assessment/Plan: 31 y.o.  status post Cesarean section. POD# 6.   Postpartum preeclampsia.   Admit.  Start Magnesium Sulfate seizure prophylaxis.          HARPER,CHARLES A. 11/24/2011, 5:05  PM

## 2011-11-24 NOTE — MAU Provider Note (Signed)
History     CSN: [id number]  Arrival date and time: 11/24/11 1452   First Provider Initiated Contact with Patient 11/24/11 1555      Chief Complaint  Patient presents with  . Hypertension   HPIJessica NYAZIA Riley is 31 y.o. G2P2002 presents after begin seen in the office for [facility] labs.  Delivered 10/28 viable female by Repeat  C-Section--Dr. Clearance Coots.   At [redacted] weeks gestation, blood pressure would be high in the office but rechecks would be normal.  Were not treated with medication or bedrest.  Her first C=Section was done for elevated blood pressures.   She began with pedal and hand edema/numbness 3 days after discharge.  Less swelling today.  Has had a headache for 2-3 days.   Has a cough and chest congestion.  Denies visual changes.   Using Percocet for headache.  Last percocet was 11 am.   4 days ago had nausea.vomiting. and had incisional bleeding--saw Dr. Clearance Coots in the office.Denies vaginal bleeding.    Past Medical History  Diagnosis Date  . GERD (gastroesophageal reflux disease)     with pregnancy  . Headache     pregnancy    Past Surgical History  Procedure Date  . Cesarean section 2008  . Wisdom tooth extraction     Family History  Problem Relation Age of Onset  . Hypertension Mother   . Hypertension Brother   . Diabetes Maternal Grandmother   . Hypertension Maternal Grandmother   . Stroke Maternal Grandmother   . Heart disease Maternal Grandmother   . Cancer Paternal Grandfather     History  Substance Use Topics  . Smoking status: Former Smoker    Quit date: 11/14/2010  . Smokeless tobacco: Not on file  . Alcohol Use: No    Allergies: No Known Allergies  Prescriptions prior to admission  Medication Sig Dispense Refill  . ibuprofen (ADVIL,MOTRIN) 600 MG tablet Take 1 tablet (600 mg total) by mouth every 6 (six) hours.  30 tablet  5  . oxyCODONE-acetaminophen (PERCOCET/ROXICET) 5-325 MG per tablet Take 1-2 tablets by mouth every 4 (four) hours as needed  for pain (moderate - severe pain).  40 tablet  0  . pantoprazole (PROTONIX) 40 MG tablet Take 40 mg by mouth daily as needed. For reflux      . Prenatal Vit-Fe Fumarate-FA (PRENATAL MULTIVITAMIN) TABS Take 1 tablet by mouth daily.      . butalbital-acetaminophen-caffeine (FIORICET, ESGIC) 50-325-40 MG per tablet Take 1 tablet by mouth 2 (two) times daily as needed. For migraines        Review of Systems  Constitutional: Negative for fever and chills.  Eyes: Negative for blurred vision and double vision.  Respiratory: Positive for cough.   Gastrointestinal: Positive for nausea. Negative for abdominal pain.  Musculoskeletal:       + edema  Neurological: Positive for headaches.   Physical Exam   Blood pressure 151/80, pulse 63, temperature 98.7 F (37.1 C), temperature source Oral, resp. rate 16, last menstrual period 02/25/2011, SpO2 100.00%, unknown if currently breastfeeding.  Physical Exam  Constitutional: She is oriented to person, place, and time. She appears well-developed and well-nourished. No distress.  HENT:  Head: Normocephalic.  Neck: Normal range of motion.  Respiratory: Effort normal.  Musculoskeletal: She exhibits edema (1+ edema ankles).  Neurological: She is alert and oriented to person, place, and time.  Skin: Skin is warm and dry.       Negative for edema of feet or hands  Psychiatric: She has a normal mood and affect. Her behavior is normal.   Results for orders placed during the hospital encounter of 11/24/11 (from the past 24 hour(s))  CBC WITH DIFFERENTIAL     Status: Normal   Collection Time   11/24/11  3:14 PM      Component Value Range   WBC 6.8  4.0 - 10.5 K/uL   RBC 4.18  3.87 - 5.11 MIL/uL   Hemoglobin 12.5  12.0 - 15.0 g/dL   HCT 56.2  13.0 - 86.5 %   MCV 88.5  78.0 - 100.0 fL   MCH 29.9  26.0 - 34.0 pg   MCHC 33.8  30.0 - 36.0 g/dL   RDW 78.4  69.6 - 29.5 %   Platelets 307  150 - 400 K/uL   Neutrophils Relative 63  43 - 77 %   Neutro Abs 4.3   1.7 - 7.7 K/uL   Lymphocytes Relative 27  12 - 46 %   Lymphs Abs 1.8  0.7 - 4.0 K/uL   Monocytes Relative 8  3 - 12 %   Monocytes Absolute 0.6  0.1 - 1.0 K/uL   Eosinophils Relative 2  0 - 5 %   Eosinophils Absolute 0.1  0.0 - 0.7 K/uL   Basophils Relative 0  0 - 1 %   Basophils Absolute 0.0  0.0 - 0.1 K/uL  COMPREHENSIVE METABOLIC PANEL     Status: Abnormal   Collection Time   11/24/11  3:14 PM      Component Value Range   Sodium 136  135 - 145 mEq/L   Potassium 4.4  3.5 - 5.1 mEq/L   Chloride 100  96 - 112 mEq/L   CO2 27  19 - 32 mEq/L   Glucose, Bld 80  70 - 99 mg/dL   BUN 15  6 - 23 mg/dL   Creatinine, Ser 2.84  0.50 - 1.10 mg/dL   Calcium 9.9  8.4 - 13.2 mg/dL   Total Protein 7.4  6.0 - 8.3 g/dL   Albumin 3.1 (*) 3.5 - 5.2 g/dL   AST 43 (*) 0 - 37 U/L   ALT 92 (*) 0 - 35 U/L   Alkaline Phosphatase 135 (*) 39 - 117 U/L   Total Bilirubin 0.3  0.3 - 1.2 mg/dL   GFR calc non Af Amer >90  >90 mL/min   GFR calc Af Amer >90  >90 mL/min  URIC ACID     Status: Normal   Collection Time   11/24/11  3:14 PM      Component Value Range   Uric Acid, Serum 6.1  2.4 - 7.0 mg/dL  LACTATE DEHYDROGENASE     Status: Normal   Collection Time   11/24/11  3:14 PM      Component Value Range   LDH 129  94 - 250 U/L   Filed Vitals:   11/24/11 1522 11/24/11 1529 11/24/11 1600 11/24/11 1626  BP: 141/67 141/84 138/100 151/80  Pulse: 75 63    Temp:      TempSrc:      Resp: 16     SpO2:       MAU Course  Procedures  MDM 16:35  Reported labs to Dr. Clearance Coots.  Reported Percocet 1 tab was given for headache.  Order given for protein/creatinine ratio.  He plans to put admit orders in.  Assessment and Plan  A:  Elevated blood pressure     Headache  1 week post-partum  P:  Admit   Koleman Marling,EVE M 11/24/2011, 4:31 PM

## 2011-11-25 LAB — CBC WITH DIFFERENTIAL/PLATELET
Eosinophils Absolute: 0.1 10*3/uL (ref 0.0–0.7)
Eosinophils Relative: 3 % (ref 0–5)
HCT: 34.7 % — ABNORMAL LOW (ref 36.0–46.0)
Lymphocytes Relative: 29 % (ref 12–46)
Lymphs Abs: 1.5 10*3/uL (ref 0.7–4.0)
MCH: 29.9 pg (ref 26.0–34.0)
MCV: 88.1 fL (ref 78.0–100.0)
Monocytes Absolute: 0.4 10*3/uL (ref 0.1–1.0)
Monocytes Relative: 8 % (ref 3–12)
Platelets: 251 10*3/uL (ref 150–400)
RBC: 3.94 MIL/uL (ref 3.87–5.11)

## 2011-11-25 LAB — COMPREHENSIVE METABOLIC PANEL
AST: 32 U/L (ref 0–37)
Albumin: 2.8 g/dL — ABNORMAL LOW (ref 3.5–5.2)
Chloride: 103 mEq/L (ref 96–112)
Creatinine, Ser: 0.74 mg/dL (ref 0.50–1.10)
Potassium: 4.3 mEq/L (ref 3.5–5.1)
Total Bilirubin: 0.2 mg/dL — ABNORMAL LOW (ref 0.3–1.2)
Total Protein: 6.1 g/dL (ref 6.0–8.3)

## 2011-11-25 MED ORDER — LACTATED RINGERS IV BOLUS (SEPSIS)
1000.0000 mL | Freq: Once | INTRAVENOUS | Status: AC
  Administered 2011-11-25: 1000 mL via INTRAVENOUS

## 2011-11-25 MED ORDER — DEXAMETHASONE SODIUM PHOSPHATE 10 MG/ML IJ SOLN
10.0000 mg | Freq: Once | INTRAMUSCULAR | Status: AC
  Administered 2011-11-25: 10 mg via INTRAVENOUS
  Filled 2011-11-25: qty 1

## 2011-11-25 MED ORDER — DIPHENHYDRAMINE HCL 50 MG/ML IJ SOLN
25.0000 mg | Freq: Once | INTRAMUSCULAR | Status: AC
  Administered 2011-11-25: 25 mg via INTRAVENOUS
  Filled 2011-11-25: qty 1

## 2011-11-25 MED ORDER — HYDROMORPHONE HCL 2 MG PO TABS
4.0000 mg | ORAL_TABLET | Freq: Four times a day (QID) | ORAL | Status: DC | PRN
  Administered 2011-11-25: 4 mg via ORAL
  Filled 2011-11-25 (×2): qty 2

## 2011-11-25 MED ORDER — MORPHINE SULFATE 4 MG/ML IJ SOLN
4.0000 mg | Freq: Once | INTRAMUSCULAR | Status: AC
  Administered 2011-11-25: 4 mg via INTRAVENOUS
  Filled 2011-11-25: qty 1

## 2011-11-25 MED ORDER — LABETALOL HCL 200 MG PO TABS
200.0000 mg | ORAL_TABLET | Freq: Three times a day (TID) | ORAL | Status: DC
  Administered 2011-11-25 – 2011-11-26 (×3): 200 mg via ORAL
  Filled 2011-11-25 (×6): qty 1

## 2011-11-25 NOTE — Progress Notes (Signed)
Post Partum Day 7 Subjective: no complaints  Objective: Blood pressure 127/86, pulse 68, temperature 97.6 F (36.4 C), temperature source Oral, resp. rate 18, height 5\' 4"  (1.626 m), weight 89.54 kg (197 lb 6.4 oz), last menstrual period 02/25/2011, SpO2 97.00%, unknown if currently breastfeeding.  Physical Exam:  General: alert and no distress Lochia: appropriate Uterine Fundus: firm Incision: healing well DVT Evaluation: No evidence of DVT seen on physical exam.   Basename 11/24/11 1514  HGB 12.5  HCT 37.0    Assessment/Plan: Postpartum preeclampsia.  Stable.  Continue Magnesium Sulfate.   LOS: 1 day   HARPER,CHARLES A 11/25/2011, 6:05 AM

## 2011-11-25 NOTE — Progress Notes (Signed)
UR chart review completed.  

## 2011-11-26 MED ORDER — LABETALOL HCL 200 MG PO TABS: 200.0000 mg | ORAL_TABLET | Freq: Three times a day (TID) | ORAL | Status: DC

## 2011-11-26 NOTE — Progress Notes (Signed)
Post Partum Day 8 Subjective: Mild HA.  Objective: Blood pressure 171/106, pulse 85, temperature 97.4 F (36.3 C), temperature source Oral, resp. rate 16, height 5\' 4"  (1.626 m), weight 91.672 kg (202 lb 1.6 oz), last menstrual period 02/25/2011, SpO2 94.00%, unknown if currently breastfeeding.  Physical Exam:  General: alert and no distress Lochia: appropriate Uterine Fundus: firm Incision: healing well DVT Evaluation: No evidence of DVT seen on physical exam.   Basename 11/25/11 0935 11/24/11 1514  HGB 11.8* 12.5  HCT 34.7* 37.0    Assessment/Plan: Discharge home   LOS: 2 days   HARPER,CHARLES A 11/26/2011, 10:17 AM

## 2011-11-26 NOTE — Discharge Summary (Signed)
Physician Discharge Summary  Patient ID: Christina Riley MRN: [id number] DOB/AGE: March 25, 1980 31 y.o.  Admit date: 11/24/2011 Discharge date: 11/26/2011  Admission Diagnoses:  Preeclampsia  Discharge Diagnoses:  Same Active Problems:  * No active hospital problems. *    Discharged Condition: good  Hospital Course:  Admitted with elevated BP.  Responded well to Magnesium Sulfate.  Discharged home improved.  Consults: None  Significant Diagnostic Studies: labs: CBC, CMET  Treatments: Magnesium Sulfate  Discharge Exam: Blood pressure 171/106, pulse 85, temperature 97.4 F (36.3 C), temperature source Oral, resp. rate 16, height 5\' 4"  (1.626 m), weight 91.672 kg (202 lb 1.6 oz), last menstrual period 02/25/2011, SpO2 94.00%, unknown if currently breastfeeding. General appearance: alert and no distress GI: normal findings: soft, non-tender and incision C,D, and intact.  Disposition: 01-Home or Self Care  Discharge Orders    Future Orders Please Complete By Expires   Diet - low sodium heart healthy      Discharge instructions      Comments:   Routine   Activity as tolerated      Lifting restrictions      Comments:   Weight restriction of 20 lbs.   Driving restriction       Comments:   Avoid driving for at least 2 weeks.   Other restrictions (specify):      Sexual acrtivity      Comments:   No sex for 6 weeks.   Call MD for:      Call MD for:  temperature >100.4      Call MD for:  persistant nausea and vomiting      Call MD for:  severe uncontrolled pain      Call MD for:  redness, tenderness, or signs of infection (pain, swelling, redness, odor or green/yellow discharge around incision site)      Call MD for:  difficulty breathing, headache or visual disturbances      Call MD for:  hives      Call MD for:  persistant dizziness or light-headedness      Call MD for:  extreme fatigue      Discharge patient          Medication List     As of 11/26/2011 10:32 AM     TAKE these medications         butalbital-acetaminophen-caffeine 50-325-40 MG per tablet   Commonly known as: FIORICET, ESGIC   Take 1 tablet by mouth 2 (two) times daily as needed. For migraines      ibuprofen 600 MG tablet   Commonly known as: ADVIL,MOTRIN   Take 1 tablet (600 mg total) by mouth every 6 (six) hours.      labetalol 200 MG tablet   Commonly known as: NORMODYNE   Take 1 tablet (200 mg total) by mouth 3 (three) times daily.      oxyCODONE-acetaminophen 5-325 MG per tablet   Commonly known as: PERCOCET/ROXICET   Take 1-2 tablets by mouth every 4 (four) hours as needed for pain (moderate - severe pain).      pantoprazole 40 MG tablet   Commonly known as: PROTONIX   Take 40 mg by mouth daily as needed. For reflux      prenatal multivitamin Tabs   Take 1 tablet by mouth daily.           Follow-up Information    Follow up with Council Munguia A, MD. Schedule an appointment as soon as possible for a visit in  6 weeks.   Contact information:   [address] [place] [address] [phone]          Signed: Odilia Damico A 11/26/2011, 10:32 AM

## 2013-06-23 ENCOUNTER — Other Ambulatory Visit: Payer: Self-pay | Admitting: Family Medicine

## 2013-06-23 DIAGNOSIS — M25511 Pain in right shoulder: Secondary | ICD-10-CM

## 2013-06-27 ENCOUNTER — Other Ambulatory Visit: Payer: Medicaid Other

## 2013-06-27 ENCOUNTER — Ambulatory Visit
Admission: RE | Admit: 2013-06-27 | Discharge: 2013-06-27 | Disposition: A | Discharge status: Home / Self Care | Payer: BC Managed Care – PPO | Source: Ambulatory Visit | Attending: Family Medicine | Admitting: Family Medicine

## 2013-06-27 DIAGNOSIS — M25511 Pain in right shoulder: Secondary | ICD-10-CM

## 2013-11-04 ENCOUNTER — Other Ambulatory Visit: Payer: Self-pay

## 2013-11-21 ENCOUNTER — Encounter (HOSPITAL_COMMUNITY): Payer: Self-pay | Admitting: *Deleted

## 2014-06-14 ENCOUNTER — Ambulatory Visit (INDEPENDENT_AMBULATORY_CARE_PROVIDER_SITE_OTHER): Payer: Medicaid Other | Admitting: Certified Nurse Midwife

## 2014-06-14 ENCOUNTER — Encounter: Payer: Self-pay | Admitting: Certified Nurse Midwife

## 2014-06-14 VITALS — BP 116/78 | HR 65 | Temp 98.4°F | Wt 142.0 lb

## 2014-06-14 DIAGNOSIS — R102 Pelvic and perineal pain: Secondary | ICD-10-CM | POA: Diagnosis not present

## 2014-06-14 DIAGNOSIS — K5901 Slow transit constipation: Secondary | ICD-10-CM

## 2014-06-14 DIAGNOSIS — Z01419 Encounter for gynecological examination (general) (routine) without abnormal findings: Secondary | ICD-10-CM

## 2014-06-14 DIAGNOSIS — Z9189 Other specified personal risk factors, not elsewhere classified: Secondary | ICD-10-CM | POA: Diagnosis not present

## 2014-06-14 DIAGNOSIS — Z0000 Encounter for general adult medical examination without abnormal findings: Secondary | ICD-10-CM

## 2014-06-14 DIAGNOSIS — K5900 Constipation, unspecified: Secondary | ICD-10-CM | POA: Insufficient documentation

## 2014-06-14 DIAGNOSIS — Z1389 Encounter for screening for other disorder: Secondary | ICD-10-CM | POA: Insufficient documentation

## 2014-06-14 LAB — COMPREHENSIVE METABOLIC PANEL
ALK PHOS: 46 U/L (ref 39–117)
ALT: 25 U/L (ref 0–35)
AST: 28 U/L (ref 0–37)
Albumin: 4.5 g/dL (ref 3.5–5.2)
BUN: 17 mg/dL (ref 6–23)
CO2: 27 mEq/L (ref 19–32)
Calcium: 9.6 mg/dL (ref 8.4–10.5)
Chloride: 101 mEq/L (ref 96–112)
Creat: 0.79 mg/dL (ref 0.50–1.10)
GLUCOSE: 75 mg/dL (ref 70–99)
Potassium: 4.3 mEq/L (ref 3.5–5.3)
Sodium: 139 mEq/L (ref 135–145)
Total Bilirubin: 0.6 mg/dL (ref 0.2–1.2)
Total Protein: 7.8 g/dL (ref 6.0–8.3)

## 2014-06-14 LAB — POCT URINALYSIS DIPSTICK
Bilirubin, UA: NEGATIVE
Blood, UA: NEGATIVE
GLUCOSE UA: NEGATIVE
Ketones, UA: NEGATIVE
Leukocytes, UA: NEGATIVE
NITRITE UA: NEGATIVE
PROTEIN UA: NEGATIVE
UROBILINOGEN UA: NEGATIVE
pH, UA: 5

## 2014-06-14 LAB — CBC
HCT: 43.6 % (ref 36.0–46.0)
HEMOGLOBIN: 14.8 g/dL (ref 12.0–15.0)
MCH: 30.8 pg (ref 26.0–34.0)
MCHC: 33.9 g/dL (ref 30.0–36.0)
MCV: 90.6 fL (ref 78.0–100.0)
MPV: 10.7 fL (ref 8.6–12.4)
PLATELETS: 242 10*3/uL (ref 150–400)
RBC: 4.81 MIL/uL (ref 3.87–5.11)
RDW: 13 % (ref 11.5–15.5)
WBC: 8 10*3/uL (ref 4.0–10.5)

## 2014-06-14 LAB — TSH: TSH: 1.368 u[IU]/mL (ref 0.350–4.500)

## 2014-06-14 MED ORDER — SENNOSIDES-DOCUSATE SODIUM 8.6-50 MG PO TABS: 1.0000 | ORAL_TABLET | Freq: Two times a day (BID) | ORAL | Status: DC

## 2014-06-14 NOTE — Progress Notes (Signed)
Patient ID: Christina Riley, female   DOB: Feb 04, 1980, 34 y.o.   MRN: [id number]   Subjective:     Christina Riley is a 34 y.o. female here for a routine exam.  Current complaints:  LRQ abdominal pain, constipation, has tried laxatives for last 6 months.  Mom BCA dx at age 66, Paternal Grandfather colon CA.  MGM, PGM had DM.  MGM CVA. Brother passed away several years ago.  Encouraged to quit using tanning bed. Sexually active with spouse.  Employed.  Desires STD screening.   Personal health questionnaire:  Is patient [name], have a family history of breast and/or ovarian cancer: yes Is there a family history of uterine cancer diagnosed at age < 44, gastrointestinal cancer, urinary tract cancer, family member who is a Personnel officer syndrome-associated carrier: yes Is the patient overweight and hypertensive, family history of diabetes, personal history of gestational diabetes, preeclampsia or PCOS: yes Is patient over 71, have PCOS,  family history of premature CHD under age 72, diabetes, smoke, have hypertension or peripheral artery disease:  yes At any time, has a partner hit, kicked or otherwise hurt or frightened you?: no Over the past 2 weeks, have you felt down, depressed or hopeless?: no Over the past 2 weeks, have you felt little interest or pleasure in doing things?:sometimes, on cymbalta   Gynecologic History Patient's last menstrual period was 05/31/2014. Contraception: tubal ligation Last Pap: unknown. Results were: normal according to the patient Last mammogram: N/A.  Obstetric History OB History  Gravida Para Term Preterm AB SAB TAB Ectopic Multiple Living  # Outcome Date GA Lbr Len/2nd Weight Sex Delivery Anes PTL Lv  2 Term 11/17/11 [redacted]w[redacted]d  6 lb 5.2 oz (2.87 kg) F CS-LTranv Spinal  Y  1 Term 2008    M CS-Unspec   Y      Past Medical History  Diagnosis Date  . GERD (gastroesophageal reflux disease)     with pregnancy  . EXBMWUXL(244.0)      pregnancy    Past Surgical History  Procedure Laterality Date  . Cesarean section  2008  . Wisdom tooth extraction       Current outpatient prescriptions:  .  DULoxetine (CYMBALTA) 20 MG capsule, Take 20 mg by mouth daily., Disp: , Rfl:  .  butalbital-acetaminophen-caffeine (FIORICET, ESGIC) 50-325-40 MG per tablet, Take 1 tablet by mouth 2 (two) times daily as needed. For migraines, Disp: , Rfl:  .  ibuprofen (ADVIL,MOTRIN) 600 MG tablet, Take 1 tablet (600 mg total) by mouth every 6 (six) hours. (Patient not taking: Reported on 06/14/2014), Disp: 30 tablet, Rfl: 5 .  labetalol (NORMODYNE) 200 MG tablet, Take 1 tablet (200 mg total) by mouth 3 (three) times daily. (Patient not taking: Reported on 06/14/2014), Disp: 90 tablet, Rfl: 0 .  oxyCODONE-acetaminophen (PERCOCET/ROXICET) 5-325 MG per tablet, Take 1-2 tablets by mouth every 4 (four) hours as needed for pain (moderate - severe pain). (Patient not taking: Reported on 06/14/2014), Disp: 40 tablet, Rfl: 0 .  pantoprazole (PROTONIX) 40 MG tablet, Take 40 mg by mouth daily as needed. For reflux, Disp: , Rfl:  .  Prenatal Vit-Fe Fumarate-FA (PRENATAL MULTIVITAMIN) TABS, Take 1 tablet by mouth daily., Disp: , Rfl:  .  senna-docusate (SENOKOT-S) 8.6-50 MG per tablet, Take 1 tablet by mouth 2 (two) times daily., Disp: 60 tablet, Rfl: 6 No Known Allergies  History  Substance Use Topics  .  Smoking status: Former Smoker    Quit date: 11/14/2010  . Smokeless tobacco: Not on file  . Alcohol Use: No    Family History  Problem Relation Age of Onset  . Hypertension Mother   . Hypertension Brother   . Diabetes Maternal Grandmother   . Hypertension Maternal Grandmother   . Stroke Maternal Grandmother   . Heart disease Maternal Grandmother   . Cancer Paternal Grandfather       Review of Systems  Constitutional: negative for fatigue and weight loss Respiratory: negative for cough and wheezing Cardiovascular: negative for chest pain,  fatigue and palpitations Gastrointestinal: negative for abdominal pain and change in bowel habits Musculoskeletal:negative for myalgias Neurological: negative for gait problems and tremors Behavioral/Psych: negative for abusive relationship, depression Endocrine: negative for temperature intolerance   Genitourinary:negative for abnormal menstrual periods, genital lesions, hot flashes, sexual problems and vaginal discharge Integument/breast: negative for breast lump, breast tenderness, nipple discharge and skin lesion(s)    Objective:       BP 116/78 mmHg  Pulse 65  Temp(Src) 98.4 F (36.9 C)  Wt 142 lb (64.411 kg)  LMP 05/31/2014 General:   alert  Skin:   no rash. ?recent nevi Left face cheek  Lungs:   clear to auscultation bilaterally  Heart:   regular rate and rhythm, S1, S2 normal, no murmur, click, rub or gallop  Breasts:   normal without suspicious masses, skin or nipple changes or axillary nodes  Abdomen:  normal findings: no organomegaly, soft, non-tender. ?scar tissue versus small hernia RLQ near edge of C-section scar.    Pelvis:  External genitalia: normal general appearance Urinary system: urethral meatus normal and bladder without fullness, nontender Vaginal: normal without tenderness, induration or masses Cervix: normal appearance Adnexa: normal bimanual exam Uterus: anteverted and non-tender, normal size   Lab Review Urine pregnancy test Labs reviewed yes Radiologic studies reviewed no  50% of 30 min visit spent on counseling and coordination of care.   Assessment:    Healthy female exam.   Chronic constipation ?hernia versus scar tissue lower right edge of C-section scar Questionable recent nevi on left cheek   Plan:    Education reviewed: calcium supplements, depression evaluation, low fat, low cholesterol diet, safe sex/STD prevention, self breast exams, skin cancer screening and weight bearing exercise. Contraception: tubal ligation. Follow up in: 1  year.   Meds ordered this encounter  Medications  . DULoxetine (CYMBALTA) 20 MG capsule    Sig: Take 20 mg by mouth daily.  Marland Kitchen senna-docusate (SENOKOT-S) 8.6-50 MG per tablet    Sig: Take 1 tablet by mouth 2 (two) times daily.    Dispense:  60 tablet    Refill:  6   Orders Placed This Encounter  Procedures  . SureSwab, Vaginosis/Vaginitis Plus  . HIV antibody (with reflex)  . Hepatitis B surface antigen  . RPR  . Hepatitis C antibody  . TSH  . CBC  . Comprehensive metabolic panel  . Ambulatory referral to Dermatology    Referral Priority:  Routine    Referral Type:  Consultation    Referral Reason:  Specialty Services Required    Requested Specialty:  Dermatology    Number of Visits Requested:  1  . Ambulatory referral to Gastroenterology    Referral Priority:  Routine    Referral Type:  Consultation    Referral Reason:  Specialty Services Required    Requested Specialty:  Gastroenterology    Number of Visits Requested:  1  .  POCT urinalysis dipstick

## 2014-06-15 LAB — HEPATITIS B SURFACE ANTIGEN: Hepatitis B Surface Ag: NEGATIVE

## 2014-06-15 LAB — RPR

## 2014-06-15 LAB — HIV ANTIBODY (ROUTINE TESTING W REFLEX): HIV: NONREACTIVE

## 2014-06-15 LAB — HEPATITIS C ANTIBODY: HCV AB: NEGATIVE

## 2014-06-16 ENCOUNTER — Telehealth: Payer: Self-pay

## 2014-06-16 ENCOUNTER — Encounter: Payer: Self-pay | Admitting: Physician Assistant

## 2014-06-16 LAB — PAP IG AND HPV HIGH-RISK: HPV DNA High Risk: NOT DETECTED

## 2014-06-16 NOTE — Telephone Encounter (Signed)
PATIENT HAS APPT WITH DR. Joanna PuffJAMES SZABO FOR DERMATOLOGY ON 07/21/14 AT 8:15AM - PATIENT WAS NOTIFIED

## 2014-06-17 LAB — SURESWAB, VAGINOSIS/VAGINITIS PLUS
ATOPOBIUM VAGINAE: 6 Log (cells/mL)
C. PARAPSILOSIS, DNA: NOT DETECTED
C. albicans, DNA: NOT DETECTED
C. glabrata, DNA: NOT DETECTED
C. trachomatis RNA, TMA: NOT DETECTED
C. tropicalis, DNA: NOT DETECTED
Gardnerella vaginalis: >8 Log (cells/mL)
LACTOBACILLUS SPECIES: NOT DETECTED Log (cells/mL)
MEGASPHAERA SPECIES: 7.9 Log (cells/mL)
N. gonorrhoeae RNA, TMA: NOT DETECTED
T. VAGINALIS RNA, QL TMA: NOT DETECTED

## 2014-06-19 ENCOUNTER — Other Ambulatory Visit: Payer: Self-pay | Admitting: Certified Nurse Midwife

## 2014-06-19 DIAGNOSIS — B9689 Other specified bacterial agents as the cause of diseases classified elsewhere: Secondary | ICD-10-CM

## 2014-06-19 DIAGNOSIS — N760 Acute vaginitis: Principal | ICD-10-CM

## 2014-06-19 MED ORDER — TINIDAZOLE 500 MG PO TABS: 2.0000 g | ORAL_TABLET | Freq: Every day | ORAL | Status: AC

## 2014-06-20 ENCOUNTER — Other Ambulatory Visit: Payer: Self-pay | Admitting: *Deleted

## 2014-06-20 DIAGNOSIS — B9689 Other specified bacterial agents as the cause of diseases classified elsewhere: Secondary | ICD-10-CM

## 2014-06-20 DIAGNOSIS — N760 Acute vaginitis: Principal | ICD-10-CM

## 2014-06-20 MED ORDER — METRONIDAZOLE 500 MG PO TABS: 500.0000 mg | ORAL_TABLET | Freq: Two times a day (BID) | ORAL | Status: DC

## 2014-07-10 ENCOUNTER — Ambulatory Visit (INDEPENDENT_AMBULATORY_CARE_PROVIDER_SITE_OTHER): Payer: Medicaid Other | Admitting: Physician Assistant

## 2014-07-10 ENCOUNTER — Encounter: Payer: Self-pay | Admitting: Physician Assistant

## 2014-07-10 VITALS — BP 112/70 | HR 66 | Ht 64.0 in | Wt 144.0 lb

## 2014-07-10 DIAGNOSIS — G8929 Other chronic pain: Secondary | ICD-10-CM

## 2014-07-10 DIAGNOSIS — K5900 Constipation, unspecified: Secondary | ICD-10-CM

## 2014-07-10 DIAGNOSIS — K649 Unspecified hemorrhoids: Secondary | ICD-10-CM

## 2014-07-10 DIAGNOSIS — R1031 Right lower quadrant pain: Secondary | ICD-10-CM | POA: Diagnosis not present

## 2014-07-10 MED ORDER — NA SULFATE-K SULFATE-MG SULF 17.5-3.13-1.6 GM/177ML PO SOLN
1.0000 | Freq: Once | ORAL | Status: AC
Start: 2014-07-10 — End: 2014-08-09

## 2014-07-10 MED ORDER — HYDROCORTISONE ACETATE 25 MG RE SUPP
25.0000 mg | Freq: Two times a day (BID) | RECTAL | Status: DC
Start: 2014-07-10 — End: 2022-08-06

## 2014-07-10 NOTE — Patient Instructions (Addendum)
We sent presriptions to [facility] [place], [place]. 1. Miralax 2. Anusol HC suppositories 3. Suprep  Eat fruits that start with "P". Peaches, Plums, Pears, Pineapple, prunes. Drink Pear juice.   Take Miralax, 1-3 capfuls daily . ( 1 dose in 8 oz of wataer).  Titrate as needed.  Call us in 3 weeks if you have no improvement with the Miralax.   You have been scheduled for a colonoscopy. Please follow written instructions given to you at your visit today.  Please pick up your prep supplies at the pharmacy within the next 1-3 days. If you use inhalers (even only as needed), please bring them with you on the day of your procedure. Your physician has requested that you go to www.startemmi.com and enter the access code given to you at your visit today. This web site gives a general overview about your procedure. However, you should still follow specific instructions given to you by our office regarding your preparation for the procedure.

## 2014-07-10 NOTE — Progress Notes (Signed)
Reviewed and agree with management. Robert D. Kaplan, M.D., FACG  

## 2014-07-10 NOTE — Progress Notes (Signed)
Patient ID: Christina Riley, female   DOB: 09-05-1980, 34 y.o.   MRN: [id number]    HPI:  Christina Riley is a 34 y.o.   female referred by Christina Riley, CNM for evaluation of right lower quadrant abdominal pain and constipation.  Christina Riley states she has had right lower quadrant pain for approximately 2 years duration. The pain is fairly constant and she always knows sits there. It is not exacerbated or alleviated with ingestion of food. Her pain tends to be worse the longer she goes between bowel movements, and is relieved when she finally has a bowel movement. She has been constipated for much of her adult life in typically skips 4 days between bowel movements and then will pass a mushy stool. She has had no bright red blood per rectum or melena. She has tried eating high-fiber foods, fruits, vegetables, and using Metamucil with little relief. She has no urinary symptoms. Her appetite as been good and her weight has been stable. She has no family history of colon cancer, colon polyps, Crohn's disease, or ulcerative colitis in her immediate family but she states that her paternal grandfather had colon cancer and a maternal great had colon cancer. Her mother had breast cancer. She has no associated fever, chills, or night sweats. She does complain of some anal itching.   Past Medical History  Diagnosis Date  . GERD (gastroesophageal reflux disease)     with pregnancy  . Headache(784.0)     pregnancy  . Depression     Past Surgical History  Procedure Laterality Date  . Cesarean section  2008  . Wisdom tooth extraction     Family History  Problem Relation Age of Onset  . Hypertension Mother   . Hypertension Brother   . Diabetes Maternal Grandmother   . Hypertension Maternal Grandmother   . Stroke Maternal Grandmother   . Heart disease Maternal Grandmother   . Cancer Paternal Grandfather    History  Substance Use Topics  . Smoking status: Former Smoker    Quit date:  11/14/2010  . Smokeless tobacco: Never Used  . Alcohol Use: No   Current Outpatient Prescriptions  Medication Sig Dispense Refill  . DULoxetine (CYMBALTA) 20 MG capsule Take 20 mg by mouth daily.    Marland Kitchen ibuprofen (ADVIL,MOTRIN) 600 MG tablet Take 1 tablet (600 mg total) by mouth every 6 (six) hours. 30 tablet 5  . senna-docusate (SENOKOT-S) 8.6-50 MG per tablet Take 1 tablet by mouth 2 (two) times daily. 60 tablet 6   No current facility-administered medications for this visit.   No Known Allergies   Review of Systems: Gen: Denies any fever, chills, sweats, anorexia, fatigue, weakness, malaise, weight loss, and sleep disorder CV: Denies chest pain, angina, palpitations, syncope, orthopnea, PND, peripheral edema, and claudication. Resp: Denies dyspnea at rest, dyspnea with exercise, cough, sputum, wheezing, coughing up blood, and pleurisy. GI: Denies vomiting blood, jaundice, and fecal incontinence.   Denies dysphagia or odynophagia. GU : Denies urinary burning, blood in urine, urinary frequency, urinary hesitancy, nocturnal urination, and urinary incontinence. MS: Denies joint pain, limitation of movement, and swelling, stiffness, low back pain, extremity pain. Denies muscle weakness, cramps, atrophy.  Derm: Denies rash, itching, dry skin, hives, moles, warts, or unhealing ulcers.  Psych: Denies depression, anxiety, memory loss, suicidal ideation, hallucinations, paranoia, and confusion. Heme: Denies bruising, bleeding, and enlarged lymph nodes. Neuro:  Denies any headaches, dizziness, paresthesias. Endo:  Denies any problems with DM, thyroid, adrenal function  LAB RESULTS: Blood work on May 25 showed a TSH of 1.368. CBC on 06/14/2014 white count 8, hemoglobin 14.8, hematocrit 43.6, platelets 242,000.    Physical Exam: BP 112/70 mmHg  Pulse 66  Ht 5\' 4"  (1.626 m)  Wt 144 lb (65.318 kg)  BMI 24.71 kg/m2  LMP 05/31/2014 Constitutional: Pleasant,well-developed female in no  acute distress. HEENT: Normocephalic and atraumatic. Conjunctivae are normal. No scleral icterus. Neck supple. No thyromegaly Cardiovascular: Normal rate, regular rhythm.  Pulmonary/chest: Effort normal and breath sounds normal. No wheezing, rales or rhonchi. Abdominal: Soft, nondistended, nontender. Bowel sounds active throughout. There are no masses palpable. No hepatomegaly. Well-healed C-section incision over pubis. Rectal: Small hemorr Extremities: no edema Lymphadenopathy: No cervical adenopathy noted. Neurological: Alert and oriented to person place and time. Skin: Skin is warm and dry. No rashes noted. Psychiatric: Normal mood and affect. Behavior is normal.  ASSESSMENT AND PLAN:  34 year old female with a two-year history of right lower quadrant pain and a long-standing history of constipation referred for evaluation. Her right-sided pain may be from some colon spasm secondary to her constipation. She will be given a trial of Mira lax one to 3 capfuls daily, she has been instructed to titrate until she obtains the desired effect. She had not been using her Senokot and prefers not to use Senokot. She has been instructed to call Christina Riley in 3 weeks and let Christina Riley know how she is doing with the Mira lax. If she has had no relief she may be considered for a trial of Amitiza. She will also be scheduled for a colonoscopy to evaluate for polyps, neoplasia, IBD, etc.The risks, benefits, and alternatives to colonoscopy with possible biopsy and possible polypectomy were discussed with the patient and they consent to proceed.  For her hemorrhoids, she will be given a trial of Anusol HCC suppositories 1 per rectum twice a day for 10 days. Follow-up recommendations will be made pending the findings of her colonoscopy.   Christina Riley, Tollie Pizza PA-C 07/10/2014, 8:58 AM  CC: Christina Riley, CNM

## 2014-09-01 ENCOUNTER — Encounter: Payer: Self-pay | Admitting: Gastroenterology

## 2014-09-01 ENCOUNTER — Ambulatory Visit (AMBULATORY_SURGERY_CENTER): Payer: Medicaid Other | Admitting: Gastroenterology

## 2014-09-01 VITALS — BP 124/77 | HR 72 | Temp 96.9°F | Resp 32 | Ht 64.0 in | Wt 144.0 lb

## 2014-09-01 DIAGNOSIS — K5909 Other constipation: Secondary | ICD-10-CM

## 2014-09-01 DIAGNOSIS — K5900 Constipation, unspecified: Secondary | ICD-10-CM

## 2014-09-01 MED ORDER — SODIUM CHLORIDE 0.9 % IV SOLN: 500.0000 mL | INTRAVENOUS | Status: DC

## 2014-09-01 NOTE — Op Note (Signed)
[facility] [address] [place], [address]   COLONOSCOPY PROCEDURE REPORT  PATIENT: Christina Riley, Christina Riley  MR#: [id number] BIRTHDATE: Nov 21, 1980 , 33  yrs. old GENDER: female ENDOSCOPIST: Louis Meckel, MD REFERRED BY: PROCEDURE DATE:  09/01/2014 PROCEDURE:   Colonoscopy, diagnostic First Screening Colonoscopy - Avg.  risk and is 50 yrs.  old or older Yes.  Prior Negative Screening - Now for repeat screening. N/A  History of Adenoma - Now for follow-up colonoscopy & has been > or = to 3 yrs.  N/A  Polyps removed today? No Recommend repeat exam, <10 yrs? No ASA CLASS:   Class I INDICATIONS:Patient is not applicable for Colorectal Neoplasm Risk Assessment for this procedure. MEDICATIONS: Monitored anesthesia care and Propofol 300 mg IV  DESCRIPTION OF PROCEDURE:   After the risks benefits and alternatives of the procedure were thoroughly explained, informed consent was obtained.  The digital rectal exam revealed no abnormalities of the rectum.   The [id number]  endoscope was introduced through the anus and advanced to the cecum, which was identified by both the appendix and ileocecal valve. No adverse events experienced.   The quality of the prep was (Suprep was used) excellent.  The instrument was then slowly withdrawn as the colon was fully examined. Estimated blood loss is zero unless otherwise noted in this procedure report.      COLON FINDINGS: There was mild diverticulosis noted in the left colon.   The examination was otherwise normal.  Retroflexed views revealed no abnormalities. The time to cecum = 4.0 Withdrawal time = 6.6   The scope was withdrawn and the procedure completed. COMPLICATIONS: There were no immediate complications.  ENDOSCOPIC IMPRESSION: 1.   Mild diverticulosis was noted in the left colon 2.   The examination was otherwise normal  RECOMMENDATIONS: Pt will consider enrollment in IBS/constipation trial,  otherwise will begin amitiza  eSigned:  Louis Meckel, MD 09/01/2014 2:56 PM   cc: Deatra James, MD

## 2014-09-01 NOTE — Progress Notes (Signed)
No problems noted in the recovery room. maw 

## 2014-09-01 NOTE — Progress Notes (Signed)
Transferred to recovery room. A/O x3, pleased with MAC.  VSS.  Report to Annette, RN. 

## 2014-09-01 NOTE — Patient Instructions (Addendum)
YOU HAD AN ENDOSCOPIC PROCEDURE TODAY AT THE [facility]:   Refer to the procedure report that was given to you for any specific questions about what was found during the examination.  If the procedure report does not answer your questions, please call your gastroenterologist to clarify.  If you requested that your care partner not be given the details of your procedure findings, then the procedure report has been included in a sealed envelope for you to review at your convenience later.  YOU SHOULD EXPECT: Some feelings of bloating in the abdomen. Passage of more gas than usual.  Walking can help get rid of the air that was put into your GI tract during the procedure and reduce the bloating. If you had a lower endoscopy (such as a colonoscopy or flexible sigmoidoscopy) you may notice spotting of blood in your stool or on the toilet paper. If you underwent a bowel prep for your procedure, you may not have a normal bowel movement for a few days.  Please Note:  You might notice some irritation and congestion in your nose or some drainage.  This is from the oxygen used during your procedure.  There is no need for concern and it should clear up in a day or so.  SYMPTOMS TO REPORT IMMEDIATELY:   Following lower endoscopy (colonoscopy or flexible sigmoidoscopy):  Excessive amounts of blood in the stool  Significant tenderness or worsening of abdominal pains  Swelling of the abdomen that is new, acute  Fever of 100F or higher   For urgent or emergent issues, a gastroenterologist can be reached at any hour by calling [phone].   DIET: Your first meal following the procedure should be a small meal and then it is ok to progress to your normal diet. Heavy or fried foods are harder to digest and may make you feel nauseous or bloated.  Likewise, meals heavy in dairy and vegetables can increase bloating.  Drink plenty of fluids but you should avoid alcoholic beverages for 24  hours.  ACTIVITY:  You should plan to take it easy for the rest of today and you should NOT DRIVE or use heavy machinery until tomorrow (because of the sedation medicines used during the test).    FOLLOW UP: Our staff will call the number listed on your records the next business day following your procedure to check on you and address any questions or concerns that you may have regarding the information given to you following your procedure. If we do not reach you, we will leave a message.  However, if you are feeling well and you are not experiencing any problems, there is no need to return our call.  We will assume that you have returned to your regular daily activities without incident.  If any biopsies were taken you will be contacted by phone or by letter within the next 1-3 weeks.  Please call us at [phone] if you have not heard about the biopsies in 3 weeks.    SIGNATURES/CONFIDENTIALITY: You and/or your care partner have signed paperwork which will be entered into your electronic medical record.  These signatures attest to the fact that that the information above on your After Visit Summary has been reviewed and is understood.  Full responsibility of the confidentiality of this discharge information lies with you and/or your care-partner.    Handouts were given to your care partner on  diverticulosis and a high fiber diet with liberal fluid intake You may resume   current medications today. Consider enrollment into IBS/constipation trial, otherwise will begin amitiza. Please call if any questions or concerns.

## 2014-09-04 ENCOUNTER — Telehealth: Payer: Self-pay | Admitting: *Deleted

## 2014-09-04 NOTE — Telephone Encounter (Signed)
  Follow up Call-  No flowsheet data found.  [phone] Patient questions:  Do you have a fever, pain , or abdominal swelling? No. Pain Score  0 *  Have you tolerated food without any problems? Yes.    Have you been able to return to your normal activities? Yes.    Do you have any questions about your discharge instructions: Diet   No. Medications  No. Follow up visit  No.  Do you have questions or concerns about your Care? No.  Actions: * If pain score is 4 or above: No action needed, pain <4.

## 2014-11-13 ENCOUNTER — Other Ambulatory Visit: Payer: Self-pay | Admitting: Family Medicine

## 2018-10-25 ENCOUNTER — Other Ambulatory Visit: Payer: Self-pay | Admitting: Orthopedic Surgery

## 2018-10-25 DIAGNOSIS — M533 Sacrococcygeal disorders, not elsewhere classified: Secondary | ICD-10-CM

## 2018-11-16 ENCOUNTER — Other Ambulatory Visit: Payer: Self-pay

## 2018-12-02 ENCOUNTER — Other Ambulatory Visit: Payer: Medicaid Other

## 2018-12-03 ENCOUNTER — Ambulatory Visit
Admission: RE | Admit: 2018-12-03 | Discharge: 2018-12-03 | Disposition: A | Discharge status: Home / Self Care | Payer: Medicaid Other | Source: Ambulatory Visit | Attending: Orthopedic Surgery | Admitting: Orthopedic Surgery

## 2018-12-03 ENCOUNTER — Other Ambulatory Visit: Payer: Self-pay

## 2018-12-03 DIAGNOSIS — M533 Sacrococcygeal disorders, not elsewhere classified: Secondary | ICD-10-CM

## 2018-12-14 ENCOUNTER — Ambulatory Visit: Payer: Medicaid Other | Admitting: Advanced Practice Midwife

## 2018-12-23 ENCOUNTER — Other Ambulatory Visit: Payer: Self-pay | Admitting: Orthopedic Surgery

## 2018-12-23 DIAGNOSIS — M533 Sacrococcygeal disorders, not elsewhere classified: Secondary | ICD-10-CM

## 2018-12-31 ENCOUNTER — Ambulatory Visit
Admission: RE | Admit: 2018-12-31 | Discharge: 2018-12-31 | Disposition: A | Discharge status: Home / Self Care | Payer: Medicaid Other | Source: Ambulatory Visit | Attending: Orthopedic Surgery | Admitting: Orthopedic Surgery

## 2018-12-31 ENCOUNTER — Other Ambulatory Visit: Payer: Self-pay

## 2018-12-31 DIAGNOSIS — M533 Sacrococcygeal disorders, not elsewhere classified: Secondary | ICD-10-CM

## 2018-12-31 MED ORDER — METHYLPREDNISOLONE ACETATE 40 MG/ML INJ SUSP (RADIOLOG
120.0000 mg | Freq: Once | INTRAMUSCULAR | Status: AC
  Administered 2018-12-31: 120 mg via INTRA_ARTICULAR

## 2018-12-31 NOTE — Discharge Instructions (Signed)

## 2019-01-03 ENCOUNTER — Ambulatory Visit: Payer: Medicaid Other | Admitting: Advanced Practice Midwife

## 2019-03-15 ENCOUNTER — Other Ambulatory Visit (HOSPITAL_COMMUNITY)
Admission: RE | Admit: 2019-03-15 | Discharge: 2019-03-15 | Disposition: A | Discharge status: Home / Self Care | Payer: Medicaid Other | Source: Ambulatory Visit | Attending: Obstetrics | Admitting: Obstetrics

## 2019-03-15 ENCOUNTER — Ambulatory Visit: Payer: Medicaid Other | Admitting: Obstetrics

## 2019-03-15 ENCOUNTER — Encounter: Payer: Self-pay | Admitting: Obstetrics

## 2019-03-15 ENCOUNTER — Other Ambulatory Visit: Payer: Self-pay

## 2019-03-15 VITALS — BP 133/90 | HR 60 | Ht 63.0 in | Wt 151.0 lb

## 2019-03-15 DIAGNOSIS — Z01419 Encounter for gynecological examination (general) (routine) without abnormal findings: Secondary | ICD-10-CM

## 2019-03-15 DIAGNOSIS — N898 Other specified noninflammatory disorders of vagina: Secondary | ICD-10-CM | POA: Insufficient documentation

## 2019-03-15 DIAGNOSIS — Z1501 Genetic susceptibility to malignant neoplasm of breast: Secondary | ICD-10-CM

## 2019-03-15 DIAGNOSIS — Z0000 Encounter for general adult medical examination without abnormal findings: Secondary | ICD-10-CM

## 2019-03-15 DIAGNOSIS — R5382 Chronic fatigue, unspecified: Secondary | ICD-10-CM

## 2019-03-15 DIAGNOSIS — Z131 Encounter for screening for diabetes mellitus: Secondary | ICD-10-CM

## 2019-03-15 DIAGNOSIS — N943 Premenstrual tension syndrome: Secondary | ICD-10-CM

## 2019-03-15 MED ORDER — VITAFOL PO TABS: 1.0000 | ORAL_TABLET | Freq: Every day | ORAL | 11 refills | Status: DC

## 2019-03-15 MED ORDER — PAROXETINE HCL 10 MG PO TABS: 10.0000 mg | ORAL_TABLET | Freq: Every day | ORAL | 11 refills | Status: DC

## 2019-03-15 NOTE — Progress Notes (Signed)
New Patient in the office for annual. Last pap 06-14-14. Mom, hx of breast cancer. Pt reports that being extremely tired and hair loss.

## 2019-03-15 NOTE — Progress Notes (Signed)
Subjective:        Christina Riley is a 39 y.o. female here for a routine exam.  Current complaints: Chronic fatigue and hair loss.    Personal health questionnaire:  Is patient [name], have a family history of breast and/or ovarian cancer: yes, mother - Breast CA Is there a family history of uterine cancer diagnosed at age < 63, gastrointestinal cancer, urinary tract cancer, family member who is a Personnel officer syndrome-associated carrier: no Is the patient overweight and hypertensive, family history of diabetes, personal history of gestational diabetes, preeclampsia or PCOS: no Is patient over 50, have PCOS,  family history of premature CHD under age 20, diabetes, smoke, have hypertension or peripheral artery disease:  no At any time, has a partner hit, kicked or otherwise hurt or frightened you?: no Over the past 2 weeks, have you felt down, depressed or hopeless?: no Over the past 2 weeks, have you felt little interest or pleasure in doing things?:no   Gynecologic History Patient's last menstrual period was 03/05/2019. Contraception: tubal ligation Last Pap: 2016. Results were: normal Last mammogram: n/a. Results were: n/a  Obstetric History OB History  Gravida Para Term Preterm AB Living  2 2 2     2   SAB TAB Ectopic Multiple Live Births          2    # Outcome Date GA Lbr Len/2nd Weight Sex Delivery Anes PTL Lv  2 Term 11/17/11 [redacted]w[redacted]d  6 lb 5.2 oz (2.87 kg) F CS-LTranv Spinal  LIV  1 Term 2008    M CS-Unspec   LIV    Past Medical History:  Diagnosis Date  . Depression   . GERD (gastroesophageal reflux disease)    with pregnancy  . 2009)    pregnancy    Past Surgical History:  Procedure Laterality Date  . CESAREAN SECTION  2008  . TUBAL LIGATION    . WISDOM TOOTH EXTRACTION       Current Outpatient Medications:  .  hydrocortisone (ANUSOL-HC) 25 MG suppository, Place 1 suppository (25 mg total) rectally 2 (two) times daily. (Patient not  taking: Reported on 03/15/2019), Disp: 20 suppository, Rfl: 0 .  Iron-Vitamins (VITAFOL) TABS, Take 1 tablet by mouth daily before breakfast., Disp: 30 tablet, Rfl: 11 .  PARoxetine (PAXIL) 10 MG tablet, Take 1 tablet (10 mg total) by mouth daily., Disp: 30 tablet, Rfl: 11 .  senna-docusate (SENOKOT-S) 8.6-50 MG per tablet, Take 1 tablet by mouth 2 (two) times daily. (Patient not taking: Reported on 03/15/2019), Disp: 60 tablet, Rfl: 6 No Known Allergies  Social History   Tobacco Use  . Smoking status: Former Smoker    Quit date: 11/14/2010    Years since quitting: 8.3  . Smokeless tobacco: Never Used  Substance Use Topics  . Alcohol use: Yes    Alcohol/week: 3.0 standard drinks    Types: 3 Shots of liquor per week    Family History  Problem Relation Age of Onset  . Diabetes Maternal Grandmother   . Hypertension Maternal Grandmother   . Stroke Maternal Grandmother   . Heart disease Maternal Grandmother   . Cancer Paternal Grandfather   . Hypertension Mother   . Breast cancer Mother   . Hypertension Brother       Review of Systems  Constitutional: positive for fatigue and weight gain Respiratory: negative for cough and wheezing Cardiovascular: negative for chest pain, fatigue and palpitations Gastrointestinal: negative for abdominal pain and change in bowel habits  Musculoskeletal:negative for myalgias Neurological: negative for gait problems and tremors Behavioral/Psych: positive for mood swings, irritability and depression ~ a week before and during period Endocrine: positive for being " cold " all the time    Genitourinary:negative for abnormal menstrual periods, genital lesions, hot flashes, sexual problems and vaginal discharge Integument/breast: negative for breast lump, breast tenderness, nipple discharge and skin lesion(s)    Objective:       BP 133/90   Pulse 60   Ht 5\' 3"  (1.6 m)   Wt 151 lb (68.5 kg)   LMP 03/05/2019   BMI 26.75 kg/m  General:   alert   Skin:   no rash or abnormalities  Lungs:   clear to auscultation bilaterally  Heart:   regular rate and rhythm, S1, S2 normal, no murmur, click, rub or gallop  Breasts:   normal without suspicious masses, skin or nipple changes or axillary nodes  Abdomen:  normal findings: no organomegaly, soft, non-tender and no hernia  Pelvis:  External genitalia: normal general appearance Urinary system: urethral meatus normal and bladder without fullness, nontender Vaginal: normal without tenderness, induration or masses Cervix: normal appearance Adnexa: normal bimanual exam Uterus: anteverted and non-tender, normal size   Lab Review Urine pregnancy test Labs reviewed yes Radiologic studies reviewed no  50% of 25 min visit spent on counseling and coordination of care.   Assessment:   1. Encounter for routine gynecological examination with Papanicolaou smear of cervix Rx: - Cytology - PAP( [facility])  2. Vaginal discharge Rx: - Cervicovaginal ancillary only( [place])  3. PMS (premenstrual syndrome) Rx: - PARoxetine (PAXIL) 10 MG tablet; Take 1 tablet (10 mg total) by mouth daily.  Dispense: 30 tablet; Refill: 11  4. Chronic fatigue Rx: - TSH - Ferritin - CBC with Differential/Platelet - Comprehensive metabolic panel - Iron-Vitamins (VITAFOL) TABS; Take 1 tablet by mouth daily before breakfast.  Dispense: 30 tablet; Refill: 11  5. Breast cancer genetic susceptibility Rx: - BRCAssure Comprehensive Panel  6. Screening for diabetes mellitus Rx: - Hemoglobin A1c    Plan:    Education reviewed: calcium supplements, depression evaluation, low fat, low cholesterol diet, safe sex/STD prevention, self breast exams and weight bearing exercise. Follow up in: 2 weeks.   Meds ordered this encounter  Medications  . Iron-Vitamins (VITAFOL) TABS    Sig: Take 1 tablet by mouth daily before breakfast.    Dispense:  30 tablet    Refill:  11  . PARoxetine (PAXIL) 10 MG tablet     Sig: Take 1 tablet (10 mg total) by mouth daily.    Dispense:  30 tablet    Refill:  11   Orders Placed This Encounter  Procedures  . TSH  . Ferritin  . CBC with Differential/Platelet  . Comprehensive metabolic panel  . Hemoglobin A1c  . BRCAssure Comprehensive Panel    Standing Status:   Future    Number of Occurrences:   1    Standing Expiration Date:   03/14/2020    Shelly Bombard, MD 03/15/2019 1:09 PM

## 2019-03-15 NOTE — Patient Instructions (Signed)
Chronic Fatigue Syndrome  Chronic fatigue syndrome (CFS) is a condition that causes extreme tiredness (fatigue). This fatigue does not improve with rest, and it gets worse with physical or mental activity. You may have several other symptoms along with fatigue.  Symptoms may come and go, but they generally last for months. Sometimes, CFS gets better over time, but it can be a lifelong condition. There is no cure, but there are many possible treatments. You will need to work with your health care providers to find a treatment plan that works best for you.  What are the causes?  The cause of CFS is not known. There may be more than one cause. Possible causes include:   An infection.   An abnormal body defense system (immune system).   Low blood pressure.   Poor diet.   Physical or emotional stress.  What increases the risk?  You are more likely to develop this condition if:   You are female.   You are 40?37 years old.   You have a family history of CFS.   You live with a lot of emotional stress.  What are the signs or symptoms?  The main symptom of CFS is fatigue that is severe enough to interfere with day-to-day activities. This fatigue does not get better with rest, and it gets worse with physical or mental activity. There are eight other major symptoms of CFS:   Lack of energy (malaise) that lasts more than 24 hours after physical exertion.   Sleep that does not relieve fatigue (unrefreshing sleep).   Short-term memory loss or confusion.   Joint pain without redness or swelling.   Muscle aches.   Headaches.   Painful and swollen glands (lymph nodes) in the neck or under the arms.   Sore throat.  You may also have:   Abdominal cramps, constipation, or diarrhea (irritable bowel).   Chills.   Night sweats.   Vision changes.   Dizziness.   Mental confusion (brain fog).   Clumsiness.   Sensitivity to food, noise, or odors.   Mood swings, depression, or anxiety attacks.  How is this  diagnosed?  There are no tests that can diagnose this condition. Your health care provider will make the diagnosis based on your medical history, a physical exam, and a mental health exam. However, it is important to make sure that your symptoms are not caused by another medical condition. You may have lab tests or X-rays to rule out other conditions.  For your health care provider to diagnose CFS:   You must have had fatigue for at least 6 straight months.   Fatigue must be your first symptom, and it must be severe enough to interfere with day-to-day activities.   There must be no other cause found for the fatigue.   You must also have at least four of the eight other major symptoms of CFS.  How is this treated?  There is no cure for CFS. The condition affects everyone differently. You will need to work with your team of health care providers to find the best treatments for your symptoms. Your team may include your primary care provider, physical and exercise therapists, and mental health therapists. Treatment may include:   Improving sleep with a regular bedtime routine.   Avoiding caffeine, alcohol, and tobacco.   Doing light exercise and stretching during the day.   Taking medicines to help you sleep or to relieve joint or muscle pain.   Learning and practicing relaxation   regularly, as told by your health care provider.  Avoid fatigue by pacing yourself during the day and getting enough sleep at night.  Go to bed and get up at the same time every day. Eating and drinking  Avoid caffeine and alcohol.  Avoid heavy meals in the evening.  Eat a well-balanced diet. General  instructions  Take over-the-counter and prescription medicines only as told by your health care provider.  Do not use herbal or dietary supplements unless they are approved by your health care provider.  Maintain a healthy weight.  Avoid stress and use stress-reducing techniques that you learn in therapy.  Do not use any products that contain nicotine or tobacco, such as cigarettes and e-cigarettes. If you need help quitting, ask your health care provider.  Consider joining a CFS support group.  Keep all follow-up visits as told by your health care provider. This is important. Contact a health care provider if:  Your symptoms do not get better or they get worse.  You feel angry, guilty, anxious, or depressed. This information is not intended to replace advice given to you by your health care provider. Make sure you discuss any questions you have with your health care provider. Document Revised: 12/19/2016 Document Reviewed: 04/16/2015 Elsevier Patient Education  2020 [employer].

## 2019-03-16 LAB — COMPREHENSIVE METABOLIC PANEL
ALT: 16 IU/L (ref 0–32)
AST: 18 IU/L (ref 0–40)
Albumin/Globulin Ratio: 1.6 (ref 1.2–2.2)
Albumin: 4.6 g/dL (ref 3.8–4.8)
Alkaline Phosphatase: 43 IU/L (ref 39–117)
BUN/Creatinine Ratio: 12 (ref 9–23)
BUN: 10 mg/dL (ref 6–20)
Bilirubin Total: 0.3 mg/dL (ref 0.0–1.2)
CO2: 23 mmol/L (ref 20–29)
Calcium: 9.4 mg/dL (ref 8.7–10.2)
Chloride: 102 mmol/L (ref 96–106)
Creatinine, Ser: 0.84 mg/dL (ref 0.57–1.00)
GFR calc Af Amer: 103 mL/min/{1.73_m2} (ref >59)
GFR calc non Af Amer: 90 mL/min/{1.73_m2} (ref >59)
Globulin, Total: 2.8 g/dL (ref 1.5–4.5)
Glucose: 79 mg/dL (ref 65–99)
Potassium: 4.4 mmol/L (ref 3.5–5.2)
Sodium: 140 mmol/L (ref 134–144)
Total Protein: 7.4 g/dL (ref 6.0–8.5)

## 2019-03-16 LAB — CBC WITH DIFFERENTIAL/PLATELET
Basophils Absolute: 0 10*3/uL (ref 0.0–0.2)
Basos: 1 %
EOS (ABSOLUTE): 0.1 10*3/uL (ref 0.0–0.4)
Eos: 2 %
Hematocrit: 40.7 % (ref 34.0–46.6)
Hemoglobin: 14 g/dL (ref 11.1–15.9)
Immature Grans (Abs): 0 10*3/uL (ref 0.0–0.1)
Immature Granulocytes: 0 %
Lymphocytes Absolute: 1.6 10*3/uL (ref 0.7–3.1)
Lymphs: 34 %
MCH: 31.3 pg (ref 26.6–33.0)
MCHC: 34.4 g/dL (ref 31.5–35.7)
MCV: 91 fL (ref 79–97)
Monocytes Absolute: 0.4 10*3/uL (ref 0.1–0.9)
Monocytes: 8 %
Neutrophils Absolute: 2.5 10*3/uL (ref 1.4–7.0)
Neutrophils: 55 %
Platelets: 236 10*3/uL (ref 150–450)
RBC: 4.47 x10E6/uL (ref 3.77–5.28)
RDW: 12 % (ref 11.7–15.4)
WBC: 4.6 10*3/uL (ref 3.4–10.8)

## 2019-03-16 LAB — CERVICOVAGINAL ANCILLARY ONLY
Bacterial Vaginitis (gardnerella): NEGATIVE
Candida Glabrata: NEGATIVE
Candida Vaginitis: NEGATIVE
Comment: NEGATIVE
Comment: NEGATIVE
Comment: NEGATIVE

## 2019-03-16 LAB — HEMOGLOBIN A1C
Est. average glucose Bld gHb Est-mCnc: 97 mg/dL
Hgb A1c MFr Bld: 5 % (ref 4.8–5.6)

## 2019-03-16 LAB — FERRITIN: Ferritin: 34 ng/mL (ref 15–150)

## 2019-03-16 LAB — TSH: TSH: 2.72 u[IU]/mL (ref 0.450–4.500)

## 2019-03-17 LAB — CYTOLOGY - PAP
Comment: NEGATIVE
Diagnosis: NEGATIVE
High risk HPV: NEGATIVE

## 2019-03-29 ENCOUNTER — Telehealth (INDEPENDENT_AMBULATORY_CARE_PROVIDER_SITE_OTHER): Payer: Medicaid Other | Admitting: Obstetrics

## 2019-03-29 ENCOUNTER — Encounter: Payer: Self-pay | Admitting: Obstetrics

## 2019-03-29 DIAGNOSIS — Z712 Person consulting for explanation of examination or test findings: Secondary | ICD-10-CM | POA: Diagnosis not present

## 2019-03-29 NOTE — Progress Notes (Signed)
TELEHEALTH GYNECOLOGY VIRTUAL VIDEO VISIT ENCOUNTER NOTE  Provider location: [facility] [place]   I connected with Christina Riley on 03/29/19 at  9:30 AM EST by Gyn MyChart Video Encounter at home and verified that I am speaking with the correct person using two identifiers.   I discussed the limitations, risks, security and privacy concerns of performing an evaluation and management service virtually and the availability of in person appointments. I also discussed with the patient that there may be a patient responsible charge related to this service. The patient expressed understanding and agreed to proceed.   History:  Christina Riley is a 39 y.o. G25P2002 female being evaluated today for discussion of lab results. She denies any abnormal vaginal discharge, bleeding, pelvic pain or other concerns.       Past Medical History:  Diagnosis Date  . Depression   . GERD (gastroesophageal reflux disease)    with pregnancy  . PPIRJJOA(416.6)    pregnancy   Past Surgical History:  Procedure Laterality Date  . CESAREAN SECTION  2008  . TUBAL LIGATION    . WISDOM TOOTH EXTRACTION     The following portions of the patient's history were reviewed and updated as appropriate: allergies, current medications, past family history, past medical history, past social history, past surgical history and problem list.   Health Maintenance:  Normal pap and negative HRHPV on 03-15-2019.    Review of Systems:  Pertinent items noted in HPI and remainder of comprehensive ROS otherwise negative.  Physical Exam:   General:  Alert, oriented and cooperative. Patient appears to be in no acute distress.  Mental Status: Normal mood and affect. Normal behavior. Normal judgment and thought content.   Respiratory: Normal respiratory effort, no problems with respiration noted  Rest of physical exam deferred due to type of encounter  Labs and Imaging Results for orders placed  or performed in visit on 03/15/19 (from the past 336 hour(s))  Cytology - PAP( [place])   Collection Time: 03/15/19 10:41 AM  Result Value Ref Range   High risk HPV Negative    Adequacy      Satisfactory for evaluation; transformation zone component PRESENT.   Diagnosis      - Negative for intraepithelial lesion or malignancy (NILM)   Comment Normal Reference Range HPV - Negative   TSH   Collection Time: 03/15/19 10:56 AM  Result Value Ref Range   TSH 2.720 0.450 - 4.500 uIU/mL  Ferritin   Collection Time: 03/15/19 10:56 AM  Result Value Ref Range   Ferritin 34 15 - 150 ng/mL  CBC with Differential/Platelet   Collection Time: 03/15/19 10:56 AM  Result Value Ref Range   WBC 4.6 3.4 - 10.8 x10E3/uL   RBC 4.47 3.77 - 5.28 x10E6/uL   Hemoglobin 14.0 11.1 - 15.9 g/dL   Hematocrit 06.3 01.6 - 46.6 %   MCV 91 79 - 97 fL   MCH 31.3 26.6 - 33.0 pg   MCHC 34.4 31.5 - 35.7 g/dL   RDW 01.0 93.2 - 35.5 %   Platelets 236 150 - 450 x10E3/uL   Neutrophils 55 Not Estab. %   Lymphs 34 Not Estab. %   Monocytes 8 Not Estab. %   Eos 2 Not Estab. %   Basos 1 Not Estab. %   Neutrophils Absolute 2.5 1.4 - 7.0 x10E3/uL   Lymphocytes Absolute 1.6 0.7 - 3.1 x10E3/uL   Monocytes Absolute 0.4 0.1 - 0.9 x10E3/uL  EOS (ABSOLUTE) 0.1 0.0 - 0.4 x10E3/uL   Basophils Absolute 0.0 0.0 - 0.2 x10E3/uL   Immature Granulocytes 0 Not Estab. %   Immature Grans (Abs) 0.0 0.0 - 0.1 x10E3/uL  Comprehensive metabolic panel   Collection Time: 03/15/19 10:56 AM  Result Value Ref Range   Glucose 79 65 - 99 mg/dL   BUN 10 6 - 20 mg/dL   Creatinine, Ser 0.84 0.57 - 1.00 mg/dL   GFR calc non Af Amer 90 >59 mL/min/1.73   GFR calc Af Amer 103 >59 mL/min/1.73   BUN/Creatinine Ratio 12 9 - 23   Sodium 140 134 - 144 mmol/L   Potassium 4.4 3.5 - 5.2 mmol/L   Chloride 102 96 - 106 mmol/L   CO2 23 20 - 29 mmol/L   Calcium 9.4 8.7 - 10.2 mg/dL   Total Protein 7.4 6.0 - 8.5 g/dL   Albumin 4.6 3.8 - 4.8 g/dL    Globulin, Total 2.8 1.5 - 4.5 g/dL   Albumin/Globulin Ratio 1.6 1.2 - 2.2   Bilirubin Total 0.3 0.0 - 1.2 mg/dL   Alkaline Phosphatase 43 39 - 117 IU/L   AST 18 0 - 40 IU/L   ALT 16 0 - 32 IU/L  Hemoglobin A1c   Collection Time: 03/15/19 10:56 AM  Result Value Ref Range   Hgb A1c MFr Bld 5.0 4.8 - 5.6 %   Est. average glucose Bld gHb Est-mCnc 97 mg/dL  Cervicovaginal ancillary only( [place])   Collection Time: 03/15/19 11:08 AM  Result Value Ref Range   Bacterial Vaginitis (gardnerella) Negative    Candida Vaginitis Negative    Candida Glabrata Negative    Comment      Normal Reference Range Bacterial Vaginosis - Negative   Comment Normal Reference Range Candida Species - Negative    Comment Normal Reference Range Candida Galbrata - Negative       Assessment and Plan:     1. Encounter to discuss test results - doing well - follow up in 1 year for Annual / Pap       I discussed the assessment and treatment plan with the patient. The patient was provided an opportunity to ask questions and all were answered. The patient agreed with the plan and demonstrated an understanding of the instructions.   The patient was advised to call back or seek an in-person evaluation/go to the ED if the symptoms worsen or if the condition fails to improve as anticipated.  I provided 10 minutes of face-to-face time during this encounter.   Baltazar Najjar, MD [facility] 03/29/2019

## 2019-04-04 LAB — BRCASSURE COMPREHENSIVE PANEL: PDF: 0

## 2020-07-19 ENCOUNTER — Other Ambulatory Visit: Payer: Self-pay | Admitting: Obstetrics

## 2020-07-19 DIAGNOSIS — N943 Premenstrual tension syndrome: Secondary | ICD-10-CM

## 2020-09-12 ENCOUNTER — Other Ambulatory Visit: Payer: Self-pay | Admitting: Obstetrics

## 2020-09-12 DIAGNOSIS — N943 Premenstrual tension syndrome: Secondary | ICD-10-CM

## 2020-11-07 ENCOUNTER — Other Ambulatory Visit: Payer: Self-pay

## 2020-11-07 ENCOUNTER — Ambulatory Visit (INDEPENDENT_AMBULATORY_CARE_PROVIDER_SITE_OTHER): Payer: Medicaid Other | Admitting: Obstetrics

## 2020-11-07 ENCOUNTER — Encounter: Payer: Self-pay | Admitting: Obstetrics

## 2020-11-07 ENCOUNTER — Other Ambulatory Visit (HOSPITAL_COMMUNITY)
Admission: RE | Admit: 2020-11-07 | Discharge: 2020-11-07 | Disposition: A | Discharge status: Home / Self Care | Payer: Medicaid Other | Source: Ambulatory Visit | Attending: Obstetrics | Admitting: Obstetrics

## 2020-11-07 ENCOUNTER — Other Ambulatory Visit: Payer: Self-pay | Admitting: Obstetrics

## 2020-11-07 VITALS — BP 116/81 | HR 63 | Ht 64.0 in | Wt 145.0 lb

## 2020-11-07 DIAGNOSIS — N898 Other specified noninflammatory disorders of vagina: Secondary | ICD-10-CM

## 2020-11-07 DIAGNOSIS — Z8742 Personal history of other diseases of the female genital tract: Secondary | ICD-10-CM

## 2020-11-07 DIAGNOSIS — Z3009 Encounter for other general counseling and advice on contraception: Secondary | ICD-10-CM

## 2020-11-07 DIAGNOSIS — Z01419 Encounter for gynecological examination (general) (routine) without abnormal findings: Secondary | ICD-10-CM | POA: Diagnosis not present

## 2020-11-07 DIAGNOSIS — N943 Premenstrual tension syndrome: Secondary | ICD-10-CM

## 2020-11-07 DIAGNOSIS — R102 Pelvic and perineal pain: Secondary | ICD-10-CM

## 2020-11-07 DIAGNOSIS — R300 Dysuria: Secondary | ICD-10-CM

## 2020-11-07 DIAGNOSIS — E569 Vitamin deficiency, unspecified: Secondary | ICD-10-CM

## 2020-11-07 LAB — POCT URINALYSIS DIPSTICK
Bilirubin, UA: NEGATIVE
Blood, UA: NEGATIVE
Glucose, UA: NEGATIVE
Ketones, UA: NEGATIVE
Leukocytes, UA: NEGATIVE
Nitrite, UA: NEGATIVE
Protein, UA: NEGATIVE
Spec Grav, UA: 1.015 (ref 1.010–1.025)
Urobilinogen, UA: 0.2 E.U./dL
pH, UA: 7 (ref 5.0–8.0)

## 2020-11-07 MED ORDER — LO LOESTRIN FE 1 MG-10 MCG / 10 MCG PO TABS: 1.0000 | ORAL_TABLET | Freq: Every day | ORAL | 4 refills | Status: DC

## 2020-11-07 MED ORDER — VITAFOL ULTRA 29-0.6-0.4-200 MG PO CAPS: 1.0000 | ORAL_CAPSULE | Freq: Every day | ORAL | 4 refills | Status: DC

## 2020-11-07 MED ORDER — CEFUROXIME AXETIL 500 MG PO TABS: 500.0000 mg | ORAL_TABLET | Freq: Two times a day (BID) | ORAL | 0 refills | Status: DC

## 2020-11-07 MED ORDER — FLUCONAZOLE 150 MG PO TABS: 150.0000 mg | ORAL_TABLET | Freq: Once | ORAL | 0 refills | Status: AC

## 2020-11-07 MED ORDER — PAROXETINE HCL ER 25 MG PO TB24: 25.0000 mg | ORAL_TABLET | Freq: Every day | ORAL | 11 refills | Status: DC

## 2020-11-07 NOTE — Progress Notes (Signed)
Pt presents for annual, pap, and STD testing. She c/o low abdominal pain and urinary retention  Pt requests BCP to help regulate cycles.  Normal pap 03/15/2019

## 2020-11-07 NOTE — Progress Notes (Signed)
Subjective:        Christina Riley is a 40 y.o. female here for a routine exam.  Current complaints: Lower abdominal pain, urinary retention and irregular cycles.    Personal health questionnaire:  Is patient Christina Riley, have a family history of breast and/or ovarian cancer: no Is there a family history of uterine cancer diagnosed at age < 74, gastrointestinal cancer, urinary tract cancer, family member who is a Personnel officer syndrome-associated carrier: no Is the patient overweight and hypertensive, family history of diabetes, personal history of gestational diabetes, preeclampsia or PCOS: no Is patient over 42, have PCOS,  family history of premature CHD under age 90, diabetes, smoke, have hypertension or peripheral artery disease:  no At any time, has a partner hit, kicked or otherwise hurt or frightened you?: no Over the past 2 weeks, have you felt down, depressed or hopeless?: no Over the past 2 weeks, have you felt little interest or pleasure in doing things?:no   Gynecologic History Patient's last menstrual period was 10/24/2020. Contraception: tubal ligation Last Pap: 03-15-2019. Results were: normal Last mammogram: n/a. Results were: n/a  Obstetric History OB History  Gravida Para Term Preterm AB Living  2 2 2     2   SAB IAB Ectopic Multiple Live Births          2    # Outcome Date GA Lbr Len/2nd Weight Sex Delivery Anes PTL Lv  2 Term 11/17/11 [redacted]w[redacted]d  6 lb 5.2 oz (2.87 kg) F CS-LTranv Spinal  LIV  1 Term 2008    M CS-Unspec   LIV    Past Medical History:  Diagnosis Date   Depression    GERD (gastroesophageal reflux disease)    with pregnancy   Headache(784.0)    pregnancy    Past Surgical History:  Procedure Laterality Date   CESAREAN SECTION  2008   TUBAL LIGATION     WISDOM TOOTH EXTRACTION       Current Outpatient Medications:    cefUROXime (CEFTIN) 500 MG tablet, Take 1 tablet (500 mg total) by mouth 2 (two) times daily with a meal., Disp: 14  tablet, Rfl: 0   Iron-Vitamins (VITAFOL) TABS, Take 1 tablet by mouth daily before breakfast., Disp: 30 tablet, Rfl: 11   LO LOESTRIN FE 1 MG-10 MCG / 10 MCG tablet, Take 1 tablet by mouth daily., Disp: 84 tablet, Rfl: 4   Prenat-Fe Poly-Methfol-FA-DHA (VITAFOL ULTRA) 29-0.6-0.4-200 MG CAPS, Take 1 capsule by mouth daily before breakfast., Disp: 90 capsule, Rfl: 4   hydrocortisone (ANUSOL-HC) 25 MG suppository, Place 1 suppository (25 mg total) rectally 2 (two) times daily., Disp: 20 suppository, Rfl: 0   PARoxetine (PAXIL-CR) 25 MG 24 hr tablet, Take 1 tablet (25 mg total) by mouth daily., Disp: 30 tablet, Rfl: 11   senna-docusate (SENOKOT-S) 8.6-50 MG per tablet, Take 1 tablet by mouth 2 (two) times daily., Disp: 60 tablet, Rfl: 6 No Known Allergies  Social History   Tobacco Use   Smoking status: Former    Types: Cigarettes    Quit date: 11/14/2010    Years since quitting: 9.9   Smokeless tobacco: Never  Substance Use Topics   Alcohol use: Yes    Alcohol/week: 3.0 standard drinks    Types: 3 Shots of liquor per week    Family History  Problem Relation Age of Onset   Diabetes Maternal Grandmother    Hypertension Maternal Grandmother    Stroke Maternal Grandmother    Heart disease Maternal  Grandmother    Cancer Paternal Grandfather    Hypertension Mother    Breast cancer Mother    Hypertension Brother       Review of Systems  Constitutional: negative for fatigue and weight loss Respiratory: negative for cough and wheezing Cardiovascular: negative for chest pain, fatigue and palpitations Gastrointestinal: negative for abdominal pain and change in bowel habits Musculoskeletal:negative for myalgias Neurological: negative for gait problems and tremors Behavioral/Psych: negative for abusive relationship, depression Endocrine: negative for temperature intolerance    Genitourinary: positive for lower abdominal pain, vaginal discharge and heavy periods.  .negative for abnormal  menstrual periods, genital lesions, hot flashes, sexual problems  Integument/breast: negative for breast lump, breast tenderness, nipple discharge and skin lesion(s)    Objective:       BP 116/81   Pulse 63   Ht 5\' 4"  (1.626 m)   Wt 145 lb (65.8 kg)   LMP 10/24/2020   BMI 24.89 kg/m  General:   Alert and no distress  Skin:   no rash or abnormalities  Lungs:   clear to auscultation bilaterally  Heart:   regular rate and rhythm, S1, S2 normal, no murmur, click, rub or gallop  Breasts:   normal without suspicious masses, skin or nipple changes or axillary nodes  Abdomen:  normal findings: no organomegaly, soft, non-tender and no hernia  Pelvis:  External genitalia: normal general appearance Urinary system: urethral meatus normal and bladder without fullness, nontender Vaginal: normal without tenderness, induration or masses Cervix: normal appearance Adnexa: normal bimanual exam Uterus: anteverted and non-tender, normal size   Lab Review Urine pregnancy test Labs reviewed yes Radiologic studies reviewed no  Results for Christina Riley, Christina Riley (MRN Wynonia Sours) as of 11/08/2020 11:47  Ref. Range 11/07/2020 11:00  Leukocytes,UA Latest Ref Range: Negative  Negative    Assessment:    1. Encounter for routine gynecological examination with Papanicolaou smear of cervix Rx: - Cytology - PAP  2. Vaginal discharge Rx: - Cervicovaginal ancillary only( )  3. Pelvic pain Rx: - 11/09/2020 PELVIC COMPLETE WITH TRANSVAGINAL; Future  4. History of heavy periods Rx: - LO LOESTRIN FE 1 MG-10 MCG / 10 MCG tablet; Take 1 tablet by mouth daily.  Dispense: 84 tablet; Refill: 4  5. Dysuria Rx: - POCT Urinalysis Dipstick - cefUROXime (CEFTIN) 500 MG tablet; Take 1 tablet (500 mg total) by mouth 2 (two) times daily with a meal.  Dispense: 14 tablet; Refill: 0 - fluconazole (DIFLUCAN) 150 MG tablet; Take 1 tablet (150 mg total) by mouth once for 1 dose.  Dispense: 1 tablet; Refill:  0  6. PMS (premenstrual syndrome) Rx: - PARoxetine (PAXIL-CR) 25 MG 24 hr tablet; Take 1 tablet (25 mg total) by mouth daily.  Dispense: 30 tablet; Refill: 11  7. Vitamin deficiency Rx: - Prenat-Fe Poly-Methfol-FA-DHA (VITAFOL ULTRA) 29-0.6-0.4-200 MG CAPS; Take 1 capsule by mouth daily before breakfast.  Dispense: 90 capsule; Refill: 4    Plan:    Education reviewed: calcium supplements, depression evaluation, low fat, low cholesterol diet, safe sex/STD prevention, self breast exams, and weight bearing exercise. Follow up in: 2 weeks.   Meds ordered this encounter  Medications   DISCONTD: PARoxetine (PAXIL CR) 25 MG 24 hr tablet    Sig: Take 1 tablet (25 mg total) by mouth daily.    Dispense:  30 tablet    Refill:  11   Prenat-Fe Poly-Methfol-FA-DHA (VITAFOL ULTRA) 29-0.6-0.4-200 MG CAPS    Sig: Take 1 capsule by mouth daily before breakfast.  Dispense:  90 capsule    Refill:  4   cefUROXime (CEFTIN) 500 MG tablet    Sig: Take 1 tablet (500 mg total) by mouth 2 (two) times daily with a meal.    Dispense:  14 tablet    Refill:  0   fluconazole (DIFLUCAN) 150 MG tablet    Sig: Take 1 tablet (150 mg total) by mouth once for 1 dose.    Dispense:  1 tablet    Refill:  0   LO LOESTRIN FE 1 MG-10 MCG / 10 MCG tablet    Sig: Take 1 tablet by mouth daily.    Dispense:  84 tablet    Refill:  4    Submit other coverage code 3  BIN:  [id number]  PCN:  [id number]   GRP:  [id number]   ID:  [id number]   PARoxetine (PAXIL-CR) 25 MG 24 hr tablet    Sig: Take 1 tablet (25 mg total) by mouth daily.    Dispense:  30 tablet    Refill:  11   Orders Placed This Encounter  Procedures   US PELVIC COMPLETE WITH TRANSVAGINAL    Standing Status:   Future    Standing Expiration Date:   11/07/2021    Order Specific Question:   Reason for Exam (SYMPTOM  OR DIAGNOSIS REQUIRED)    Answer:   Pelvic pain    Order Specific Question:   Preferred imaging location?    Answer:   [facility] Ultrasound   POCT  Urinalysis Dipstick     Brock Bad, MD 11/08/2020 11:46 AM

## 2020-11-08 ENCOUNTER — Other Ambulatory Visit: Payer: Self-pay | Admitting: Obstetrics

## 2020-11-08 LAB — CERVICOVAGINAL ANCILLARY ONLY
Bacterial Vaginitis (gardnerella): NEGATIVE
Candida Glabrata: NEGATIVE
Candida Vaginitis: NEGATIVE
Chlamydia: NEGATIVE
Comment: NEGATIVE
Comment: NEGATIVE
Comment: NEGATIVE
Comment: NEGATIVE
Comment: NEGATIVE
Comment: NORMAL
Neisseria Gonorrhea: NEGATIVE
Trichomonas: NEGATIVE

## 2020-11-08 MED ORDER — PAROXETINE HCL ER 25 MG PO TB24: 25.0000 mg | ORAL_TABLET | Freq: Every day | ORAL | 11 refills | Status: DC

## 2020-11-09 LAB — CYTOLOGY - PAP
Comment: NEGATIVE
Diagnosis: NEGATIVE
High risk HPV: NEGATIVE

## 2020-11-12 ENCOUNTER — Other Ambulatory Visit: Payer: Self-pay | Admitting: Obstetrics

## 2020-11-15 ENCOUNTER — Ambulatory Visit: Payer: 59

## 2021-01-23 ENCOUNTER — Encounter: Payer: Self-pay | Admitting: Physical Therapy

## 2021-01-23 ENCOUNTER — Other Ambulatory Visit: Payer: Self-pay

## 2021-01-23 ENCOUNTER — Ambulatory Visit: Payer: Medicaid Other | Attending: Family Medicine | Admitting: Physical Therapy

## 2021-01-23 DIAGNOSIS — M5441 Lumbago with sciatica, right side: Secondary | ICD-10-CM | POA: Insufficient documentation

## 2021-01-23 DIAGNOSIS — G8929 Other chronic pain: Secondary | ICD-10-CM | POA: Diagnosis present

## 2021-01-23 DIAGNOSIS — M6281 Muscle weakness (generalized): Secondary | ICD-10-CM | POA: Diagnosis present

## 2021-01-23 DIAGNOSIS — M62838 Other muscle spasm: Secondary | ICD-10-CM | POA: Diagnosis present

## 2021-01-23 NOTE — Therapy (Signed)
[facility] [address]  [address] [place], [place], [address] Phone: [phone]   Fax:  [phone]  Physical Therapy Evaluation  Patient Details  Name: Christina Riley MRN: [id number] Date of Birth: 01-18-1981 Referring Provider (PT): Althea Charon, [place]   Encounter Date: 01/23/2021   PT End of Session - 01/23/21 1752     Visit Number 1    Number of Visits 12    Date for PT Re-Evaluation 03/06/21    Authorization Type UHC Medicaid    PT Start Time 0500    PT Stop Time 0550    PT Time Calculation (min) 50 min    Activity Tolerance Patient tolerated treatment well    Behavior During Therapy [facility] for tasks assessed/performed             Past Medical History:  Diagnosis Date   Depression    GERD (gastroesophageal reflux disease)    with pregnancy   Headache(784.0)    pregnancy    Past Surgical History:  Procedure Laterality Date   CESAREAN SECTION  2008   TUBAL LIGATION     WISDOM TOOTH EXTRACTION      There were no vitals filed for this visit.    Subjective Assessment - 01/23/21 1704     Subjective Pt. reports ongoing back pain for several years, getting worse.  Says they did another MRI which showed more degeneration at L5.  Tried chiropractic but that seems to make it worse.  did have steroid injection in SIJ and didn't think that helped much either. Feels like a lot of the issue is coming from her hip.  Having trouble with sleep, standing, sitting.  Started some core exercises which have helped a little.  Also has a lot of joint pain and swelling.    Pertinent History chronic LBP x 4 years    Limitations Sitting;Lifting;Standing;House hold activities    How long can you sit comfortably? constantly has to shift positions 25-30 min    How long can you stand comfortably? cant stand for long periods, 25- 30 min    How long can you walk comfortably? seems to help    Diagnostic tests per patient had MRI  showing degeneration in L5    Patient Stated Goals "help with the pain"    Currently in Pain? Yes    Pain Score 4    8/10 at worst over last few days   Pain Location Buttocks    Pain Orientation Right    Pain Descriptors / Indicators Aching;Dull;Sharp    Pain Radiating Towards starts R hip radidates back towards tailbone    Pain Onset More than a month ago    Pain Frequency Constant    Aggravating Factors  sitting/standing still    Pain Relieving Factors movement, ice/heat temporarily helps,    Effect of Pain on Daily Activities difficulty with sleeping.                [facility] PT Assessment - 01/23/21 0001       Assessment   Medical Diagnosis M54.50,G89.29 (ICD-10-CM) - Chronic low back pain    Referring Provider (PT) Althea Charon, [place]    Onset Date/Surgical Date --   4 years chronic   Hand Dominance Right    Next MD Visit for injections not sure when    Prior Therapy no      Precautions   Precautions None      Restrictions   Weight Bearing Restrictions  No      Balance Screen   Has the patient fallen in the past 6 months No    Has the patient had a decrease in activity level because of a fear of falling?  No    Is the patient reluctant to leave their home because of a fear of falling?  No      Home Environment   Living Environment Private residence    Living Arrangements Spouse/significant other;Children    Type of Home House    Home Access Level entry    Home Layout Two level    Alternate Level Stairs-Number of Steps 12      Prior Function   Level of Independence Independent    Vocation Full time employment    Journalist, newspaperVocation Requirements insurance- desk work    Leisure exercise- [employer]      Observation/Other Assessments   Focus on Therapeutic Outcomes (FOTO)  lumbar 46%, 56% predicted after 10 visits      ROM / Strength   AROM / PROM / Strength AROM;PROM;Strength      AROM   Overall AROM  Deficits    Overall AROM Comments increased pain with lumbar  extension, also side bending with pain R side.    AROM Assessment Site Lumbar    Lumbar Flexion WNL    Lumbar Extension extremely limited    Lumbar - Right Side Bend to mid thigh   pain in R low back   Lumbar - Left Side Bend to mid thigh   pain R low back   Lumbar - Right Rotation WNL   pain R Low back   Lumbar - Left Rotation WNL      PROM   Overall PROM  Within functional limits for tasks performed    Overall PROM Comments good hip mobility bil      Strength   Overall Strength Within functional limits for tasks performed    Overall Strength Comments 5/5 bil LE strength all myotomes, can walk on heels and toes.  Weakness noted in abdominals, difficulty with pelvic tilt and bridging.      Flexibility   Soft Tissue Assessment /Muscle Length yes    Hamstrings mild tightness but able to perform SLR to ~ 100 deg bil    Quadriceps significant tightness, R lacking 10 inches heel to buttock, L 5 inches    ITB no tightness    Piriformis tenderness, tightness      Palpation   Spinal mobility good mobility but tenderness over L4/L5    SI assessment  tenderness bil R >> L    Palpation comment tenderness throughout lumbar paraspinals, QL, glutes and piriformis, R > L      Special Tests   Other special tests negative SLR bil, positive supine to long sit on R, positive FABER R (pain in R SIJ)                        Objective measurements completed on examination: See above findings.       [facility] Adult PT Treatment/Exercise - 01/23/21 0001       Manual Therapy   Manual Therapy Muscle Energy Technique    Manual therapy comments to correct pelvic obliquity    Muscle Energy Technique resisted R hip flexion 5 x 5 sec isometric hold, negative retest.                     PT Education -  01/23/21 1810     Education Details educated on findings and plan of care.    Person(s) Educated Patient    Methods Explanation    Comprehension Verbalized understanding               PT Short Term Goals - 01/23/21 1759       PT SHORT TERM GOAL #1   Title Pt will be independent with initial HEP.    Time 2    Period Weeks    Status New    Target Date 02/06/21               PT Long Term Goals - 01/23/21 1759       PT LONG TERM GOAL #1   Title Pt. will be compliant with progressed HEP for core strengthening to improve outcomes.    Time 6    Period Weeks    Status New    Target Date 03/06/21      PT LONG TERM GOAL #2   Title Pt. will report 75% improvement in overall LBP/R hip pain.    Time 6    Period Weeks    Status New    Target Date 03/06/21      PT LONG TERM GOAL #3   Title Pt. will demonstrate pain free lumbar ROM.    Baseline increased pain with extension and RSB    Time 6    Period Weeks    Status New    Target Date 03/06/21      PT LONG TERM GOAL #4   Title Pt. will score >56% on FOTO to demonstrate improved QOL and function.    Baseline 46%    Time 6    Period Weeks    Status New    Target Date 03/06/21                    Plan - 01/23/21 1753     Clinical Impression Statement Reatha HarpsJessica Hogsett is a 41 year old female referred for chronic LBP.  She has had pain for about 4 years and SIJ injections.  Today she demonstrates pain and tenderness throughout R sided lumbar paraspinals and gluts, as well as L5/sacrum and R SIJ joint.  She also had positive supine to long sit test for R pelvic obliquity (anterior rotation) and postive FABER on R with pain in R SIJ.  After muscle energy technique she had negative supine to long sit test.  She would benefit from skilled physical therapy to decrease pain, improve tolerance to activities, and improve QOL.    Personal Factors and Comorbidities Comorbidity 2;Time since onset of injury/illness/exacerbation    Comorbidities chronic LBP, C-section x 2, GI issues.    Examination-Activity Limitations Bed Mobility;Locomotion Level;Lift;Sit;Stairs;Stand;Sleep;Squat;Bend;Carry     Examination-Participation Restrictions Cleaning;Community Activity;Occupation    Stability/Clinical Decision Making Stable/Uncomplicated    Clinical Decision Making Low    Rehab Potential Good    PT Frequency 2x / week    PT Duration 6 weeks    PT Treatment/Interventions ADLs/Self Care Home Management;Cryotherapy;Electrical Stimulation;Ultrasound;Functional mobility training;Therapeutic activities;Stair training;Therapeutic exercise;Balance training;Neuromuscular re-education;Patient/family education;Manual techniques;Passive range of motion;Dry needling;Taping;Spinal Manipulations;Joint Manipulations    PT Next Visit Plan initiate HEP for neutral spine exercises, modalities, manual therapy PRN.  Dry needling.    Consulted and Agree with Plan of Care Patient             Patient will benefit from skilled therapeutic intervention in order to improve the following deficits  and impairments:  Decreased range of motion, Decreased strength, Hypomobility, Increased fascial restricitons, Pain, Decreased mobility, Increased muscle spasms, Postural dysfunction  Visit Diagnosis: Chronic right-sided low back pain with right-sided sciatica  Other muscle spasm  Muscle weakness (generalized)     Problem List Patient Active Problem List   Diagnosis Date Noted   Encounter for surveillance of abnormal nevi 06/14/2014   Constipation 06/14/2014    Jena Gauss, PT, DPT  01/23/2021, 6:12 PM  [facility] [address]  [address] [place], [place], [address] Phone: [phone]   Fax:  [phone]  Name: Christina Riley MRN: [id number] Date of Birth: [birth year]

## 2021-01-28 ENCOUNTER — Ambulatory Visit: Payer: Medicaid Other

## 2021-01-30 ENCOUNTER — Encounter: Payer: Self-pay | Admitting: Physical Therapy

## 2021-01-30 ENCOUNTER — Ambulatory Visit: Payer: Medicaid Other | Admitting: Physical Therapy

## 2021-01-30 ENCOUNTER — Other Ambulatory Visit: Payer: Self-pay

## 2021-01-30 DIAGNOSIS — G8929 Other chronic pain: Secondary | ICD-10-CM

## 2021-01-30 DIAGNOSIS — M6281 Muscle weakness (generalized): Secondary | ICD-10-CM

## 2021-01-30 DIAGNOSIS — M62838 Other muscle spasm: Secondary | ICD-10-CM

## 2021-01-30 DIAGNOSIS — M5441 Lumbago with sciatica, right side: Secondary | ICD-10-CM | POA: Diagnosis not present

## 2021-01-30 NOTE — Therapy (Signed)
[facility] [address]  [address] [place], [place], [address] Phone: [phone]   Fax:  [phone]  Physical Therapy Treatment  Patient Details  Name: Christina Riley MRN: [id number] Date of Birth: [birth year] Referring Provider (PT): Althea Charon, [place]   Encounter Date: 01/30/2021   PT End of Session - 01/30/21 1706     Visit Number 2    Number of Visits 12    Date for PT Re-Evaluation 03/06/21    Authorization Type UHC Medicaid    PT Start Time 1703    PT Stop Time 1750    PT Time Calculation (min) 47 min    Activity Tolerance Patient tolerated treatment well    Behavior During Therapy [facility] for tasks assessed/performed             Past Medical History:  Diagnosis Date   Depression    GERD (gastroesophageal reflux disease)    with pregnancy   Headache(784.0)    pregnancy    Past Surgical History:  Procedure Laterality Date   CESAREAN SECTION  2008   TUBAL LIGATION     WISDOM TOOTH EXTRACTION      There were no vitals filed for this visit.   Subjective Assessment - 01/30/21 1705     Subjective Patient reports back has been "[employer]" some days feels better then moves wrong and pain returns, so really about the same.    Pertinent History chronic LBP x 4 years    Limitations Sitting;Lifting;Standing;House hold activities    How long can you sit comfortably? constantly has to shift positions 25-30 min    How long can you stand comfortably? cant stand for long periods, 25- 30 min    How long can you walk comfortably? 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 Yes    Pain Score 4     Pain Location Buttocks    Pain Orientation Right    Pain Type Chronic pain    Pain Onset More than a month ago                               [facility] Adult PT Treatment/Exercise - 01/30/21  0001       Exercises   Exercises Lumbar      Lumbar Exercises: Stretches   Lower Trunk Rotation 5 reps    Pelvic Tilt 10 reps      Lumbar Exercises: Aerobic   Nustep L6 x 7 min      Lumbar Exercises: Supine   Pelvic Tilt 20 reps    Pelvic Tilt Limitations 2 x 10 with 5 sec ball squeeze    Clam 20 reps    Clam Limitations 2 x 10 with TrA contraction and RTB    Bridge 10 reps    Bridge Limitations with TrA contraction, reported increased LBP      Lumbar Exercises: Sidelying   Clam Both;20 reps    Clam Limitations 2 x 10 RTB, cues for technique      Manual Therapy   Manual Therapy Joint mobilization;Soft tissue mobilization;Myofascial release    Manual therapy comments to decrease pain and improve mobility    Joint Mobilization PA mobs sacrum grade 1-2    Soft tissue mobilization IASTM with foam roller to bil  glutes and lumbar paraspinals, cross friction R SIJ    Myofascial Release TPR to L5 lumbar multifid                       PT Short Term Goals - 01/30/21 1757       PT SHORT TERM GOAL #1   Title Pt will be independent with initial HEP.    Time 2    Period Weeks    Status On-going   01/30/21- issued   Target Date 02/06/21               PT Long Term Goals - 01/30/21 1757       PT LONG TERM GOAL #1   Title Pt. will be compliant with progressed HEP for core strengthening to improve outcomes.    Time 6    Period Weeks    Status On-going    Target Date 03/06/21      PT LONG TERM GOAL #2   Title Pt. will report 75% improvement in overall LBP/R hip pain.    Time 6    Period Weeks    Status On-going    Target Date 03/06/21      PT LONG TERM GOAL #3   Title Pt. will demonstrate pain free lumbar ROM.    Baseline increased pain with extension and RSB    Time 6    Period Weeks    Status On-going    Target Date 03/06/21      PT LONG TERM GOAL #4   Title Pt. will score >56% on FOTO to demonstrate improved QOL and function.    Baseline 46%     Time 6    Period Weeks    Status On-going    Target Date 03/06/21                   Plan - 01/30/21 1754     Clinical Impression Statement Christina Riley contines to report LBP/R SIJ pain.  Today focused on establishing HEP with neutral spine exercses.  Reported sharp pain with bridges, but no pain with neutral spine and posterior pelvic tilt.  She also reported increased pain due to positioning in prone, so definite preference for posterior pelvic tilt positioning.  Noted palpable trigger points today in R L5 multifidi. She would benefit from continued skilled therapy.    Personal Factors and Comorbidities Comorbidity 2;Time since onset of injury/illness/exacerbation    Comorbidities chronic LBP, C-section x 2, GI issues.    Examination-Activity Limitations Bed Mobility;Locomotion Level;Lift;Sit;Stairs;Stand;Sleep;Squat;Bend;Carry    Examination-Participation Restrictions Cleaning;Community Activity;Occupation    Stability/Clinical Decision Making Stable/Uncomplicated    Rehab Potential Good    PT Frequency 2x / week    PT Duration 6 weeks    PT Treatment/Interventions ADLs/Self Care Home Management;Cryotherapy;Electrical Stimulation;Ultrasound;Functional mobility training;Therapeutic activities;Stair training;Therapeutic exercise;Balance training;Neuromuscular re-education;Patient/family education;Manual techniques;Passive range of motion;Dry needling;Taping;Spinal Manipulations;Joint Manipulations    PT Next Visit Plan initiate HEP for neutral spine exercises, modalities, manual therapy PRN.  Dry needling.    Consulted and Agree with Plan of Care Patient             Patient will benefit from skilled therapeutic intervention in order to improve the following deficits and impairments:  Decreased range of motion, Decreased strength, Hypomobility, Increased fascial restricitons, Pain, Decreased mobility, Increased muscle spasms, Postural dysfunction  Visit Diagnosis: Chronic  right-sided low back pain with right-sided sciatica  Other muscle spasm  Muscle weakness (generalized)  Problem List Patient Active Problem List   Diagnosis Date Noted   Encounter for surveillance of abnormal nevi 06/14/2014   Constipation 06/14/2014    Christina Riley, PT, DPT  01/30/2021, 5:59 PM  [facility] [address]  [address] [place], [place], [address] Phone: [phone]   Fax:  [phone]  Name: Wynonia SoursJessica M Pricer MRN: [id number] Date of Birth: 06/07/1980

## 2021-01-31 ENCOUNTER — Encounter: Payer: 59 | Admitting: Physical Therapy

## 2021-02-04 ENCOUNTER — Ambulatory Visit: Payer: Medicaid Other

## 2021-02-11 ENCOUNTER — Other Ambulatory Visit: Payer: Self-pay

## 2021-02-11 ENCOUNTER — Encounter: Payer: Self-pay | Admitting: Physical Therapy

## 2021-02-11 ENCOUNTER — Ambulatory Visit: Payer: Medicaid Other | Admitting: Physical Therapy

## 2021-02-11 DIAGNOSIS — G8929 Other chronic pain: Secondary | ICD-10-CM

## 2021-02-11 DIAGNOSIS — M62838 Other muscle spasm: Secondary | ICD-10-CM

## 2021-02-11 DIAGNOSIS — M5441 Lumbago with sciatica, right side: Secondary | ICD-10-CM | POA: Diagnosis not present

## 2021-02-11 DIAGNOSIS — M6281 Muscle weakness (generalized): Secondary | ICD-10-CM

## 2021-02-11 NOTE — Therapy (Signed)
[facility] [address]  [place], [place], [address] Phone: [phone]   Fax:  [phone]  Physical Therapy Treatment  Patient Details  Name: Christina Riley MRN: [id number] Date of Birth: 06/28/1980 Referring Provider (PT): Rip Harbour, [place]   Encounter Date: 02/11/2021   PT End of Session - 02/11/21 1705     Visit Number 3    Number of Visits 12    Date for PT Re-Evaluation 03/06/21    Authorization Type UHC Medicaid    PT Start Time 1703    PT Stop Time [id number]    PT Time Calculation (min) 42 min    Activity Tolerance Patient tolerated treatment well    Behavior During Therapy [facility] for tasks assessed/performed             Past Medical History:  Diagnosis Date   Depression    GERD (gastroesophageal reflux disease)    with pregnancy   Headache(784.0)    pregnancy    Past Surgical History:  Procedure Laterality Date   CESAREAN SECTION  2008   TUBAL LIGATION     WISDOM TOOTH EXTRACTION      There were no vitals filed for this visit.   Subjective Assessment - 02/11/21 1704     Subjective Patient reports rough couple of days.  Sometimes just wakes up with it bad.    Pertinent History chronic LBP x 4 years    Limitations Sitting;Lifting;Standing;House hold activities    How long can you sit comfortably? constantly has to shift positions 25-30 min    How long can you stand comfortably? cant stand for long periods, 25- 30 min    How long can you walk comfortably? 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 Yes    Pain Score 7     Pain Location Back    Pain Orientation Right;Lower;Medial    Pain Descriptors / Indicators Aching    Pain Onset More than a month ago                               [facility] Adult PT Treatment/Exercise - 02/11/21 0001       Exercises    Exercises Lumbar      Lumbar Exercises: Stretches   Pelvic Tilt 10 reps    Pelvic Tilt Limitations cues to focus on posterior rotation to neutral, anterior is painful      Lumbar Exercises: Aerobic   Nustep L5 x 6 min      Lumbar Exercises: Supine   Clam 20 reps    Clam Limitations 2 x 10 with TrA contraction and RTB    Bridge 20 reps    Bridge Limitations 2 x 10 with RTB and TrA contraction, cues to lift higher second set      Manual Therapy   Manual Therapy Joint mobilization;Soft tissue mobilization;Myofascial release;Other (comment)    Manual therapy comments to decrease pain and improve mobility    Joint Mobilization PA mobs sacrum grade 1-2    Soft tissue mobilization STM to lumbar paraspinals    Other Manual Therapy skilled monitoring and palpation with dry needling.              Trigger Point Dry Needling - 02/11/21 0001  Consent Given? Yes    Education Handout Provided Yes    Muscles Treated Back/Hip Lumbar multifidi    Dry Needling Comments bil L3-L5    Lumbar multifidi Response Twitch response elicited;Palpable increased muscle length                   PT Education - 02/11/21 1802     Education Details education on purpose and risks of dry needling, all questions answered.    Person(s) Educated Patient    Methods Explanation;Handout    Comprehension Verbalized understanding              PT Short Term Goals - 01/30/21 1757       PT SHORT TERM GOAL #1   Title Pt will be independent with initial HEP.    Time 2    Period Weeks    Status On-going   01/30/21- issued   Target Date 02/06/21               PT Long Term Goals - 01/30/21 1757       PT LONG TERM GOAL #1   Title Pt. will be compliant with progressed HEP for core strengthening to improve outcomes.    Time 6    Period Weeks    Status On-going    Target Date 03/06/21      PT LONG TERM GOAL #2   Title Pt. will report 75% improvement in overall LBP/R hip pain.    Time  6    Period Weeks    Status On-going    Target Date 03/06/21      PT LONG TERM GOAL #3   Title Pt. will demonstrate pain free lumbar ROM.    Baseline increased pain with extension and RSB    Time 6    Period Weeks    Status On-going    Target Date 03/06/21      PT LONG TERM GOAL #4   Title Pt. will score >56% on FOTO to demonstrate improved QOL and function.    Baseline 46%    Time 6    Period Weeks    Status On-going    Target Date 03/06/21                   Plan - 02/11/21 1759     Clinical Impression Statement Pt. continues to report LBP/R SIJ pain and is very frustrated with pain and symptoms.  Does report short term relief with HEP.  Today tried step and bend for posterior rotation innominate but this aggravated low back pain, despite consistently reporting increased SIJ pain with anterior rotation.  Trialed dry needling of lumbar multifidi today in addition to manual therapy, tolerated well but did not report any significant differents.  To continue to perform HEP.    Personal Factors and Comorbidities Comorbidity 2;Time since onset of injury/illness/exacerbation    Comorbidities chronic LBP, C-section x 2, GI issues.    Examination-Activity Limitations Bed Mobility;Locomotion Level;Lift;Sit;Stairs;Stand;Sleep;Squat;Bend;Carry    Examination-Participation Restrictions Cleaning;Community Activity;Occupation    Stability/Clinical Decision Making Stable/Uncomplicated    Rehab Potential Good    PT Frequency 2x / week    PT Duration 6 weeks    PT Treatment/Interventions ADLs/Self Care Home Management;Cryotherapy;Electrical Stimulation;Ultrasound;Functional mobility training;Therapeutic activities;Stair training;Therapeutic exercise;Balance training;Neuromuscular re-education;Patient/family education;Manual techniques;Passive range of motion;Dry needling;Taping;Spinal Manipulations;Joint Manipulations    PT Next Visit Plan initiate HEP for neutral spine exercises,  modalities, manual therapy PRN.  Dry needling.    Consulted and Agree  with Plan of Care Patient             Patient will benefit from skilled therapeutic intervention in order to improve the following deficits and impairments:  Decreased range of motion, Decreased strength, Hypomobility, Increased fascial restricitons, Pain, Decreased mobility, Increased muscle spasms, Postural dysfunction  Visit Diagnosis: Chronic right-sided low back pain with right-sided sciatica  Other muscle spasm  Muscle weakness (generalized)     Problem List Patient Active Problem List   Diagnosis Date Noted   Encounter for surveillance of abnormal nevi 06/14/2014   Constipation 06/14/2014    Rennie Natter, PT, DPT  02/11/2021, 6:03 PM  [facility] [address]  [place], [place], [address] Phone: [phone]   Fax:  [phone]  Name: Christina Riley MRN: [id number] Date of Birth: 1980-12-16

## 2021-02-11 NOTE — Patient Instructions (Signed)

## 2021-02-13 ENCOUNTER — Ambulatory Visit: Payer: Medicaid Other | Admitting: Physical Therapy

## 2021-02-13 ENCOUNTER — Other Ambulatory Visit: Payer: Self-pay

## 2021-02-13 ENCOUNTER — Encounter: Payer: Self-pay | Admitting: Physical Therapy

## 2021-02-13 DIAGNOSIS — M62838 Other muscle spasm: Secondary | ICD-10-CM

## 2021-02-13 DIAGNOSIS — M6281 Muscle weakness (generalized): Secondary | ICD-10-CM

## 2021-02-13 DIAGNOSIS — G8929 Other chronic pain: Secondary | ICD-10-CM

## 2021-02-13 DIAGNOSIS — M5441 Lumbago with sciatica, right side: Secondary | ICD-10-CM | POA: Diagnosis not present

## 2021-02-13 NOTE — Therapy (Signed)
[facility] [address]  [address] [place], [place], [address] Phone: [phone]   Fax:  [phone]  Physical Therapy Treatment  Patient Details  Name: Christina Riley MRN: [id number] Date of Birth: 07/21/1980 Referring Provider (PT): Althea Charon[place], [place]   Encounter Date: 02/13/2021   PT End of Session - 02/13/21 1700     Visit Number 4    Number of Visits 12    Date for PT Re-Evaluation 03/06/21    Authorization Type UHC Medicaid    PT Start Time 1655    PT Stop Time 1736    PT Time Calculation (min) 41 min    Activity Tolerance Patient tolerated treatment well    Behavior [facility] tasks assessed/performed             Past Medical History:  Diagnosis Date   Depression    GERD (gastroesophageal reflux disease)    with pregnancy   Headache(784.0)    pregnancy    Past Surgical History:  Procedure Laterality Date   CESAREAN SECTION  2008   TUBAL LIGATION     WISDOM TOOTH EXTRACTION      There were no vitals filed for this visit.   Subjective Assessment - 02/13/21 1657     Subjective Patient reports she has felt better the past couple of days, thinks the dry needling did help.    Pertinent History chronic LBP x 4 years    Limitations Sitting;Lifting;Standing;House hold activities    How long can you sit comfortably? constantly has to shift positions 25-30 min    How long can you stand comfortably? cant stand for long periods, 25- 30 min    How long can you walk comfortably? 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 Yes    Pain Score 3     Pain Location Back    Pain Orientation Right;Lower    Pain Descriptors / Indicators Dull    Pain Onset More than a month ago                               [facility]Ex[facility] - 02/13/21 0001        Exercises   Exercises Lumbar      Lumbar Exercises: Aerobic   Nustep L5 x 6 min      Lumbar Exercises: Supine   Clam 20 reps    Clam Limitations 2 x 10 with TrA contraction and RTB    Bridge 20 reps    Bridge Limitations 2 x 10 with RTB and TrA contraction, cues to lift higher second set    Other Supine Lumbar Exercises isometric hamstring sets 10 x 5 sec hold      Lumbar Exercises: Sidelying   Clam Both;20 reps    Clam Limitations 2 x 10 RTB, cues for technique      Lumbar Exercises: Quadruped   Opposite Arm/Leg Raise Right arm/Left leg;Left arm/Right leg;15 reps    Opposite Arm/Leg Raise Limitations 3 x 5 bil,      Manual Therapy   Manual Therapy Soft tissue mobilization;Myofascial release;Other (comment)    Manual therapy comments to decrease pain and improve mobility    Soft tissue mobilization STM to lumbar paraspinals    Myofascial Release TPR  to R piriformis, glut med    Other Manual Therapy skilled monitoring and palpation with dry needling.              Trigger Point Dry Needling - 02/13/21 0001     Consent Given? Yes    Education Handout Provided Previously provided    Muscles Treated Back/Hip Gluteus medius;Piriformis;Lumbar multifidi    Dry Needling Comments R L4-5, R glut med/piriformis    Gluteus Medius Response Twitch response elicited;Palpable increased muscle length    Piriformis Response Twitch response elicited;Palpable increased muscle length    Lumbar multifidi Response Twitch response elicited;Palpable increased muscle length                     PT Short Term Goals - 02/13/21 1700       PT SHORT TERM GOAL #1   Title Pt will be independent with initial HEP.    Time 2    Period Weeks    Status Achieved   01/30/21- issued   Target Date 02/06/21               PT Long Term Goals - 01/30/21 1757       PT LONG TERM GOAL #1   Title Pt. will be compliant with progressed HEP for core strengthening to improve outcomes.    Time 6     Period Weeks    Status On-going    Target Date 03/06/21      PT LONG TERM GOAL #2   Title Pt. will report 75% improvement in overall LBP/R hip pain.    Time 6    Period Weeks    Status On-going    Target Date 03/06/21      PT LONG TERM GOAL #3   Title Pt. will demonstrate pain free lumbar ROM.    Baseline increased pain with extension and RSB    Time 6    Period Weeks    Status On-going    Target Date 03/06/21      PT LONG TERM GOAL #4   Title Pt. will score >56% on FOTO to demonstrate improved QOL and function.    Baseline 46%    Time 6    Period Weeks    Status On-going    Target Date 03/06/21                   Plan - 02/13/21 1741     Clinical Impression Statement Patient reports significant improvement in pain following last session.  Today she was able to perform all exercises with less discomfort and gaurding, and bridging much higher without cues.  She also reports performing pilates based exercises at home today in morning without pain, by putting feet on coffee table.  She continues to have strong preference for posterior tilt and discussed how this position maintains that position. Noted some trigger points in glut med and piriformis with manual therapy, consented to dry needling these muscles as well.  Reported some discomfort after primarily from prone position, even though pillow placed under abdominals, but overall decreased tightness.    Personal Factors and Comorbidities Comorbidity 2;Time since onset of injury/illness/exacerbation    Comorbidities chronic LBP, C-section x 2, GI issues.    Examination-Activity Limitations Bed Mobility;Locomotion Level;Lift;Sit;Stairs;Stand;Sleep;Squat;Bend;Carry    Examination-Participation Restrictions Cleaning;Community Activity;Occupation    Stability/Clinical Decision Making Stable/Uncomplicated    Rehab Potential Good    PT Frequency 2x / week    PT Duration 6 weeks  PT Treatment/Interventions ADLs/Self Care  Home Management;Cryotherapy;Electrical Stimulation;Ultrasound;Functional mobility training;Therapeutic activities;Stair training;Therapeutic exercise;Balance training;Neuromuscular re-education;Patient/family education;Manual techniques;Passive range of motion;Dry needling;Taping;Spinal Manipulations;Joint Manipulations    PT Next Visit Plan initiate HEP for neutral spine exercises, modalities, manual therapy PRN.  Dry needling.    Consulted and Agree with Plan of Care Patient             Patient will benefit from skilled therapeutic intervention in order to improve the following deficits and impairments:  Decreased range of motion, Decreased strength, Hypomobility, Increased fascial restricitons, Pain, Decreased mobility, Increased muscle spasms, Postural dysfunction  Visit Diagnosis: Chronic right-sided low back pain with right-sided sciatica  Other muscle spasm  Muscle weakness (generalized)     Problem List Patient Active Problem List   Diagnosis Date Noted   Encounter for surveillance of abnormal nevi 06/14/2014   Constipation 06/14/2014    Jena Gauss, PT, DPT  02/13/2021, 5:44 PM  [facility] [address]  [address] [place], [place], [address] Phone: [phone]   Fax:  [phone]  Name: Christina Riley MRN: [id number] Date of Birth: August 01, 1980

## 2021-02-18 ENCOUNTER — Encounter: Payer: 59 | Admitting: Physical Therapy

## 2021-02-19 ENCOUNTER — Ambulatory Visit: Payer: Medicaid Other

## 2021-02-21 ENCOUNTER — Ambulatory Visit: Payer: Medicaid Other

## 2021-02-25 ENCOUNTER — Encounter: Payer: Medicaid Other | Admitting: Physical Therapy

## 2021-03-01 ENCOUNTER — Encounter: Payer: Medicaid Other | Admitting: Physical Therapy

## 2021-03-04 ENCOUNTER — Encounter: Payer: Medicaid Other | Admitting: Physical Therapy

## 2021-03-19 ENCOUNTER — Encounter: Payer: Self-pay | Admitting: Physical Therapy

## 2021-03-19 ENCOUNTER — Ambulatory Visit: Payer: Medicaid Other | Attending: Family Medicine | Admitting: Physical Therapy

## 2021-03-19 ENCOUNTER — Other Ambulatory Visit: Payer: Self-pay

## 2021-03-19 DIAGNOSIS — M6281 Muscle weakness (generalized): Secondary | ICD-10-CM | POA: Insufficient documentation

## 2021-03-19 DIAGNOSIS — M5441 Lumbago with sciatica, right side: Secondary | ICD-10-CM | POA: Diagnosis not present

## 2021-03-19 DIAGNOSIS — M62838 Other muscle spasm: Secondary | ICD-10-CM | POA: Diagnosis present

## 2021-03-19 DIAGNOSIS — G8929 Other chronic pain: Secondary | ICD-10-CM | POA: Diagnosis present

## 2021-03-19 NOTE — Therapy (Signed)
[facility] [address]  [place], [place], [address] Phone: [phone]   Fax:  [phone]  Physical Therapy Treatment Progress Note Reporting Period 01/23/2021 to 03/19/2021  See note below for Objective Data and Assessment of Progress/Goals.      Patient Details  Name: Christina Riley MRN: [id number] Date of Birth: [birth year] Referring Provider (PT): Rip Harbour, [place]   Encounter Date: 03/19/2021   PT End of Session - 03/19/21 1710     Visit Number 5    Number of Visits 17    Date for PT Re-Evaluation 04/30/21    Authorization Type [facility] Medicaid    Authorization Time Period limited to 27 per year, no initial auth required.    Progress Note Due on Visit 15    PT Start Time 1705    PT Stop Time 1750    PT Time Calculation (min) 45 min    Activity Tolerance Patient tolerated treatment well    Behavior During Therapy WFL for tasks assessed/performed             Past Medical History:  Diagnosis Date   Depression    GERD (gastroesophageal reflux disease)    with pregnancy   Headache(784.0)    pregnancy    Past Surgical History:  Procedure Laterality Date   CESAREAN SECTION  2008   TUBAL LIGATION     WISDOM TOOTH EXTRACTION      There were no vitals filed for this visit.   Subjective Assessment - 03/19/21 1708     Subjective Patient reported she went out of town and missed visits as result, taken some time to get back on schedule.  She reports cutting exercise durration to 30-45 min/day and spending more time with stretches which has helped with back.  Also felt the dry needling helped.    Pertinent History chronic LBP x 4 years    Limitations Sitting;Lifting;Standing;House hold activities    How long can you sit comfortably? constantly has to shift positions 25-30 min    How long can you stand comfortably? cant stand for long periods, 25- 30 min    How long can you walk comfortably? 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 Yes    Pain Score 3     Pain Location Back    Pain Orientation Right;Lower    Pain Onset More than a month ago                [facility] PT Assessment - 03/19/21 0001       Assessment   Medical Diagnosis M54.50,G89.29 (ICD-10-CM) - Chronic low back pain    Referring Provider (PT) Rip Harbour, [place]      Observation/Other Assessments   Focus on Therapeutic Outcomes (FOTO)  lumbar 44%      AROM   Overall AROM  Deficits    Overall AROM Comments increased pain with lumbar flexion and extension in R side back    Lumbar Flexion increased pain, can touch toes    Lumbar Extension limited increased pain    Lumbar - Right Side Bend to mid thigh    Lumbar - Left Side Bend to mid thigh    Lumbar - Right Rotation WNL    Lumbar - Left Rotation WNL      Palpation   Palpation comment tenderness  R SIJ, preference for anterior rotation                           [facility] Adult PT Treatment/Exercise - 03/19/21 0001       Self-Care   Self-Care Other Self-Care Comments    Other Self-Care Comments  education on TMR based techniques focusing on side of preference to shorten tight/painful muscles rather than aggravete, review of POC, goals, and current exercises, encouraged to focus on TrA contraction with pilates for anterior rotation, also can perform exercises on side of preference as well.      Exercises   Exercises Lumbar      Lumbar Exercises: Aerobic   Stationary Bike L6 x 6 min      Lumbar Exercises: Standing   Other Standing Lumbar Exercises repeated modified lunge to anterior rotate pelvis 2 x 10      Lumbar Exercises: Supine   Other Supine Lumbar Exercises LTR 2 x 10 to side of preference (R), followed by stretch 2 x 30 sec hold to R      Manual Therapy   Manual Therapy Soft tissue mobilization;Myofascial release;Other  (comment)    Manual therapy comments to decrease pain and improve mobility    Soft tissue mobilization STM to lumbar paraspinals    Myofascial Release TPR to R piriformis, glut med    Other Manual Therapy skilled monitoring and palpation with dry needling.              Trigger Point Dry Needling - 03/19/21 0001     Consent Given? Yes    Education Handout Provided Previously provided    Muscles Treated Back/Hip Lumbar multifidi;Gluteus medius    Dry Needling Comments bil L4-5, R glut med    Gluteus Medius Response Twitch response elicited;Palpable increased muscle length    Lumbar multifidi Response Palpable increased muscle length                     PT Short Term Goals - 02/13/21 1700       PT SHORT TERM GOAL #1   Title Pt will be independent with initial HEP.    Time 2    Period Weeks    Status Achieved   01/30/21- issued   Target Date 02/06/21               PT Long Term Goals - 03/19/21 1725       PT LONG TERM GOAL #1   Title Pt. will be compliant with progressed HEP for core strengthening to improve outcomes.    Time 6    Period Weeks    Status On-going   03/19/21- met for current   Target Date 04/30/21      PT LONG TERM GOAL #2   Title Pt. will report 75% improvement in overall LBP/R hip pain.    Time 6    Period Weeks    Status On-going   03/19/21- 10% improvement   Target Date 04/30/21      PT LONG TERM GOAL #3   Title Pt. will demonstrate pain free lumbar ROM.    Baseline increased pain with extension and RSB    Time 6    Period Weeks    Status On-going   03/19/21- decreased pain with R SB but R sided LBP with flexion and extension   Target Date 04/30/21      PT LONG TERM GOAL #4   Title  Pt. will score >56% on FOTO to demonstrate improved QOL and function.    Baseline 46%    Time 6    Period Weeks    Status On-going   03/19/21- 44%, no significant change.   Target Date 04/30/21                   Plan - 03/19/21 1758      Clinical Impression Statement Patient returns to PT after absence of ~ 1 month due to wedding/vacation and conflicts.  She reports no significant changes since last seen, and also reports no significant improvement, which is not unexpected due to poor consistency with attendance.  Her FOTO is 44% and she continues to report pain with lumbar flexion/extension, and poor tolerance to standing prolonged periods.  Today focused on core strengthening exercises with education on performing on side of preference to shorten tight muscles rather than aggravate by stretching, which she tolerated well.  Also performed manual therapy and dry needling again, noted more trigger points in R glut med.  She woud benefit from continued skilled therapy 1-2x/week for 6 additional weeks to decrease pain and improve activity tolerance.    Personal Factors and Comorbidities Comorbidity 2;Time since onset of injury/illness/exacerbation    Comorbidities chronic LBP, C-section x 2, GI issues.    Examination-Activity Limitations Bed Mobility;Locomotion Level;Lift;Sit;Stairs;Stand;Sleep;Squat;Bend;Carry    Examination-Participation Restrictions Cleaning;Community Activity;Occupation    Stability/Clinical Decision Making Stable/Uncomplicated    Rehab Potential Good    PT Frequency 2x / week    PT Duration 6 weeks    PT Treatment/Interventions ADLs/Self Care Home Management;Cryotherapy;Electrical Stimulation;Ultrasound;Functional mobility training;Therapeutic activities;Stair training;Therapeutic exercise;Balance training;Neuromuscular re-education;Patient/family education;Manual techniques;Passive range of motion;Dry needling;Taping;Spinal Manipulations;Joint Manipulations    PT Next Visit Plan neutral spine exercise, focus on anterior pelvic tilt, modalities manual PRN, mobs to R SIJ    Consulted and Agree with Plan of Care Patient             Patient will benefit from skilled therapeutic intervention in order to improve  the following deficits and impairments:  Decreased range of motion, Decreased strength, Hypomobility, Increased fascial restricitons, Pain, Decreased mobility, Increased muscle spasms, Postural dysfunction  Visit Diagnosis: Chronic right-sided low back pain with right-sided sciatica  Other muscle spasm  Muscle weakness (generalized)     Problem List Patient Active Problem List   Diagnosis Date Noted   Encounter for surveillance of abnormal nevi 06/14/2014   Constipation 06/14/2014    Rennie Natter, PT, DPT  03/19/2021, 6:07 PM  [facility] [address]  [place], [place], [address] Phone: [phone]   Fax:  [phone]  Name: Christina Riley MRN: [id number] Date of Birth: 13-Sep-1980

## 2021-03-28 ENCOUNTER — Encounter: Payer: Medicaid Other | Admitting: Physical Therapy

## 2021-04-09 ENCOUNTER — Other Ambulatory Visit: Payer: Self-pay

## 2021-04-09 ENCOUNTER — Ambulatory Visit: Payer: Medicaid Other | Attending: Family Medicine | Admitting: Physical Therapy

## 2021-04-09 ENCOUNTER — Encounter: Payer: Self-pay | Admitting: Physical Therapy

## 2021-04-09 DIAGNOSIS — M6281 Muscle weakness (generalized): Secondary | ICD-10-CM | POA: Insufficient documentation

## 2021-04-09 DIAGNOSIS — M5441 Lumbago with sciatica, right side: Secondary | ICD-10-CM | POA: Diagnosis not present

## 2021-04-09 DIAGNOSIS — M62838 Other muscle spasm: Secondary | ICD-10-CM | POA: Diagnosis present

## 2021-04-09 DIAGNOSIS — G8929 Other chronic pain: Secondary | ICD-10-CM | POA: Diagnosis present

## 2021-04-09 NOTE — Therapy (Addendum)
PHYSICAL THERAPY DISCHARGE SUMMARY ? ?Visits from Start of Care: 6  ? ?Current functional level related to goals / functional outcomes: ?Minimal improvement in pain  ?  ?Remaining deficits: ?Continued R SIJ/R hip/LB pain.  ?  ?Education / Equipment: ?HEP  ?Plan: ?Patient agrees to discharge.  Patient goals were not met. Patient is being discharged and referred back to MD due to no improvement with PT.      ? ?Rennie Natter, PT, DPT 04/30/2021 9:25AM ? ? ? ?[facility] ?[address] ?[place], [place], [address] ?Phone: [phone]   Fax:  [phone] ? ?Physical Therapy Treatment ? ?Patient Details  ?Name: Christina Riley ?MRN: [id number] ?Date of Birth: 06-27-1980 ?Referring Provider (PT): Rip Harbour, [place] ? ? ?Encounter Date: 04/09/2021 ? ? PT End of Session - 04/09/21 1810   ? ? Visit Number 6   ? Number of Visits 17   ? Date for PT Re-Evaluation 04/30/21   ? Authorization Type [employer] Medicaid   ? Authorization Time Period limited to 27 per year, no initial auth required.   ? Progress Note Due on Visit 15   ? PT Start Time 1703   ? PT Stop Time 9935   ? PT Time Calculation (min) 42 min   ? Activity Tolerance Patient tolerated treatment well;Patient limited by pain   ? Behavior During Therapy [facility] for tasks assessed/performed   ? ?  ?  ? ?  ? ? ?Past Medical History:  ?Diagnosis Date  ? Depression   ? GERD (gastroesophageal reflux disease)   ? with pregnancy  ? Headache(784.0)   ? pregnancy  ? ? ?Past Surgical History:  ?Procedure Laterality Date  ? CESAREAN SECTION  2008  ? TUBAL LIGATION    ? WISDOM TOOTH EXTRACTION    ? ? ?There were no vitals filed for this visit. ? ? Subjective Assessment - 04/09/21 1708   ? ? Subjective Pt. reports rough couple of days.  Not sure why.  Has tried to change workouts to see if helps with no improvement. Felt it more in her hip than her back this morning.   ? Pertinent History chronic LBP x 4 years   ?  Limitations Sitting;Lifting;Standing;House hold activities   ? How long can you sit comfortably? constantly has to shift positions 25-30 min   ? How long can you stand comfortably? cant stand for long periods, 25- 30 min   ? How long can you walk comfortably? seems to help   ? 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 Patient Stated Goals "help with the pain"   ? Currently in Pain? Yes   ? Pain Score 7    ? Pain Location Hip   ? Pain Orientation Right   ? Pain Onset More than a month ago   ? ?  ?  ? ?  ? ? ? ? ? [facility] PT Assessment - 04/09/21 0001   ? ?  ? Special Tests  ? Other special tests FABER positive R, positive R hip scour, increased pain R hip ER in prone.   ? ?  ?  ? ?  ? ? ? ? ? ? ? ? ? ? ? ? ? ? ? ? [facility] Adult PT Treatment/Exercise - 04/09/21 0001   ? ?  ? Self-Care  ? Self-Care Other Self-Care Comments   ? Other Self-Care Comments  discussion of findings, and recommendations to  return to referring provider to discuss R hip pain.   ?  ? Lumbar Exercises: Aerobic  ? Stationary Bike L2 x 6 min   ?  ? Manual Therapy  ? Manual Therapy Joint mobilization;Soft tissue mobilization;Myofascial release   ? Manual therapy comments to decrease pain and assess R hip mobility   ? Joint Mobilization PA mobs sacrum grade 1-2   ? Soft tissue mobilization STM to lumbar paraspinals   ? Myofascial Release TPR to R piriformis, glut med, long leg distraction (poorly tolerated), hip distraction (poorly tolerated)   ? ?  ?  ? ?  ? ? ? ? ? ? ? ? ? ? ? ? PT Short Term Goals - 02/13/21 1700   ? ?  ? PT SHORT TERM GOAL #1  ? Title Pt will be independent with initial HEP.   ? Time 2   ? Period Weeks   ? Status Achieved   01/30/21- issued  ? Target Date 02/06/21   ? ?  ?  ? ?  ? ? ? ? PT Long Term Goals - 03/19/21 1725   ? ?  ? PT LONG TERM GOAL #1  ? Title Pt. will be compliant with progressed HEP for core strengthening to improve outcomes.   ? Time 6   ? Period Weeks   ? Status On-going   03/19/21- met for  current  ? Target Date 04/30/21   ?  ? PT LONG TERM GOAL #2  ? Title Pt. will report 75% improvement in overall LBP/R hip pain.   ? Time 6   ? Period Weeks   ? Status On-going   03/19/21- 10% improvement  ? Target Date 04/30/21   ?  ? PT LONG TERM GOAL #3  ? Title Pt. will demonstrate pain free lumbar ROM.   ? Baseline increased pain with extension and RSB   ? Time 6   ? Period Weeks   ? Status On-going   03/19/21- decreased pain with R SB but R sided LBP with flexion and extension  ? Target Date 04/30/21   ?  ? PT LONG TERM GOAL #4  ? Title Pt. will score >56% on FOTO to demonstrate improved QOL and function.   ? Baseline 46%   ? Time 6   ? Period Weeks   ? Status On-going   03/19/21- 44%, no significant change.  ? Target Date 04/30/21   ? ?  ?  ? ?  ? ? ? ? ? ? ? ? Plan - 04/09/21 1811   ? ? Clinical Impression Statement Patient reports continued R hip/SI/LBP despite good compliance with HEP, and only minimal improvement with PT.  Reporting feeling like pain is more in hip now than back.  Today noted pain with R FABER, R hip scour, R hip ER, distraction of femoral/acetabular joint, and even long leg distraction.  Slight tenderness/tightness in R piriformis and SIJ.  Based on findings, discussed return to orthopedist as she may require more imaging, including R hip to rule out hip pathology that may be referring to back/SIJ.   ? Personal Factors and Comorbidities Comorbidity 2;Time since onset of injury/illness/exacerbation   ? Comorbidities chronic LBP, C-section x 2, GI issues.   ? Examination-Activity Limitations Bed Mobility;Locomotion Level;Lift;Sit;Stairs;Stand;Sleep;Squat;Bend;Carry   ? Examination-Participation Restrictions Cleaning;Community Activity;Occupation   ? Stability/Clinical Decision Making Stable/Uncomplicated   ? Rehab Potential Good   ? PT Frequency 2x / week   ? PT Duration 6 weeks   ?  PT Treatment/Interventions ADLs/Self Care Home Management;Cryotherapy;Electrical  Stimulation;Ultrasound;Functional mobility training;Therapeutic activities;Stair training;Therapeutic exercise;Balance training;Neuromuscular re-education;Patient/family education;Manual techniques;Passive range of motion;Dry needling;Taping;Spinal Manipulations;Joint Manipulations   ? PT Next Visit Plan neutral spine exercise, focus on anterior pelvic tilt, modalities manual PRN, mobs to R SIJ   ? Consulted and Agree with Plan of Care Patient   ? ?  ?  ? ?  ? ? ?Patient will benefit from skilled therapeutic intervention in order to improve the following deficits and impairments:  Decreased range of motion, Decreased strength, Hypomobility, Increased fascial restricitons, Pain, Decreased mobility, Increased muscle spasms, Postural dysfunction ? ?Visit Diagnosis: ?Chronic right-sided low back pain with right-sided sciatica ? ?Other muscle spasm ? ?Muscle weakness (generalized) ? ? ? ? ?Problem List ?Patient Active Problem List  ? Diagnosis Date Noted  ? Encounter for surveillance of abnormal nevi 06/14/2014  ? Constipation 06/14/2014  ? ? ?Rennie Natter, PT, DPT  ?04/09/2021, 6:21 PM ? ? ?[facility] ?[place] ?[place], [place], [address] ?Phone: [phone]   Fax:  [phone] ? ?Name: DASHAE WILCHER ?MRN: [id number] ?Date of Birth: 1980-05-19 ? ? ? ?

## 2021-04-16 ENCOUNTER — Ambulatory Visit: Payer: Medicaid Other

## 2021-04-24 ENCOUNTER — Encounter: Payer: Medicaid Other | Admitting: Physical Therapy

## 2021-08-01 IMAGING — CT CT BIOPSY
4 of 7 series · 12 of 32 positions shown, 17 images · non-contrast
Comparison: none

CLINICAL DATA: Right sacroiliac pain.

[Series 2: needle -guided injection · axial · 0.74mm/px · z∈[-84,-14]mm · 6 of 50 slices shown, 11 images (1 of 4)]
[im 8/50  soft-tissue]
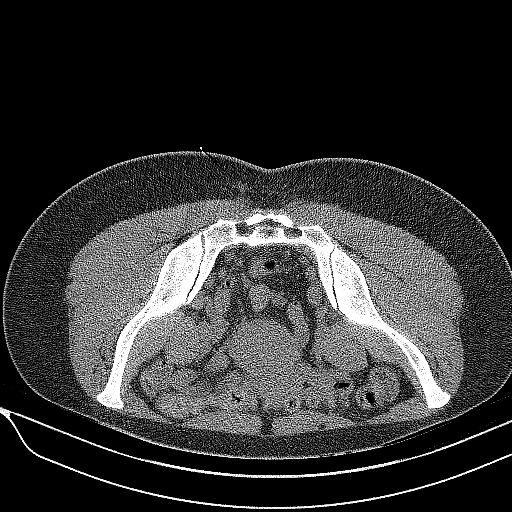
[im 8/50  bone]
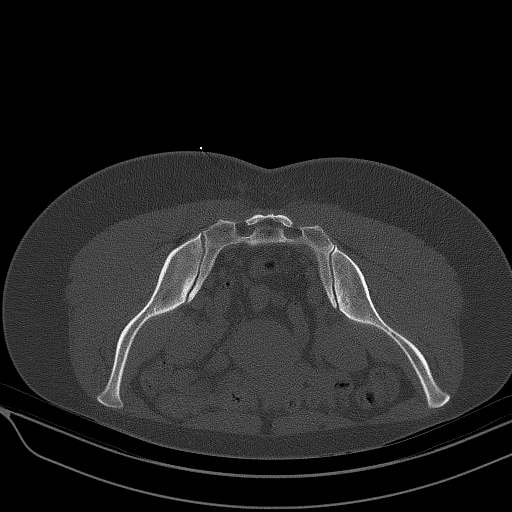
[im 15/50  soft-tissue]
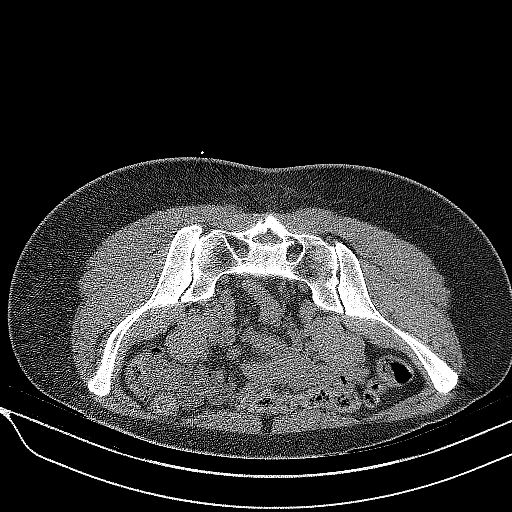
[im 22/50  soft-tissue]
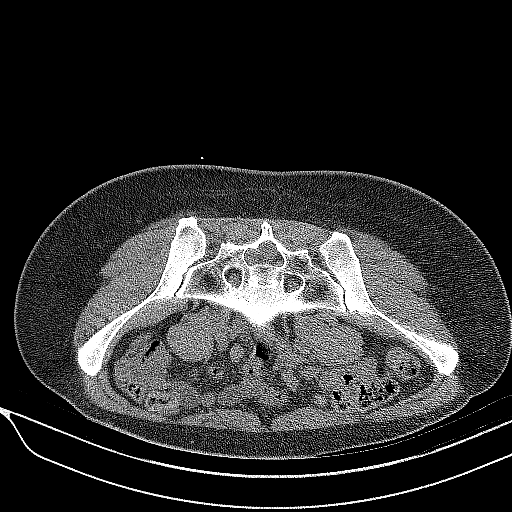
[im 22/50  lung]
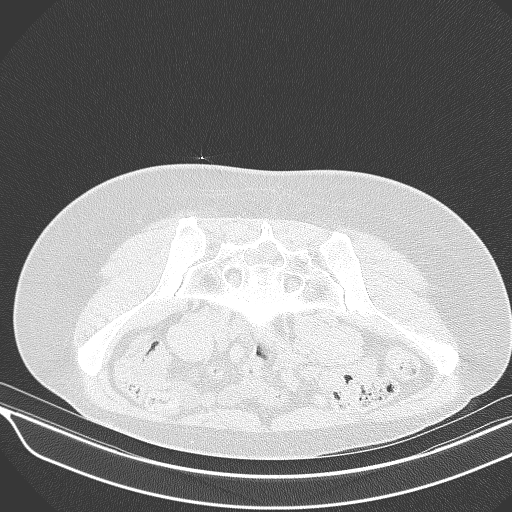
[im 29/50  soft-tissue]
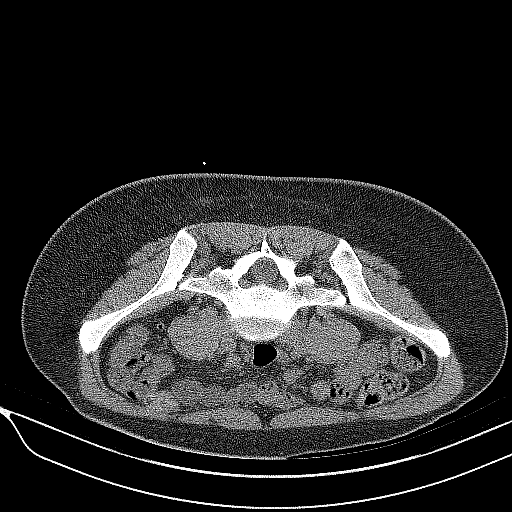
[im 29/50  lung]
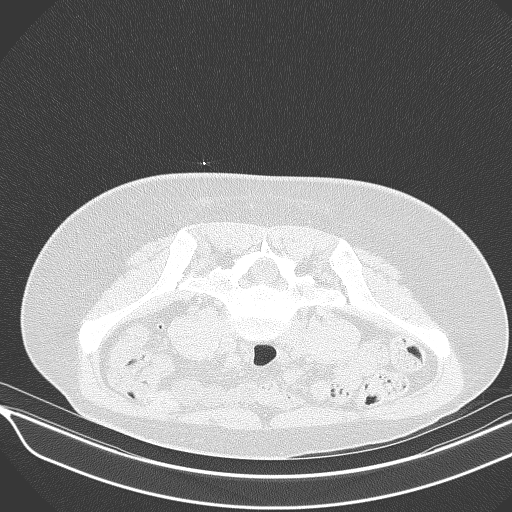
[im 36/50  soft-tissue]
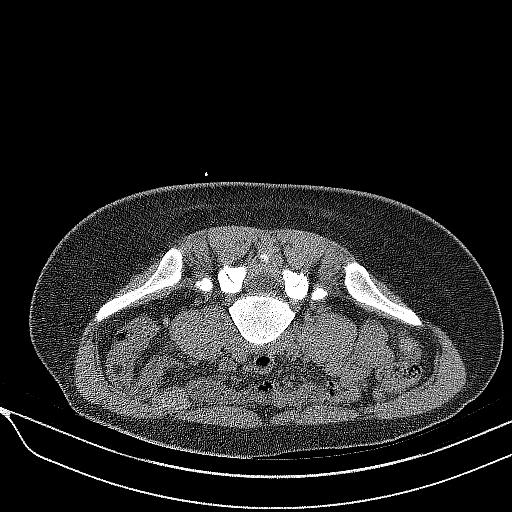
[im 36/50  lung]
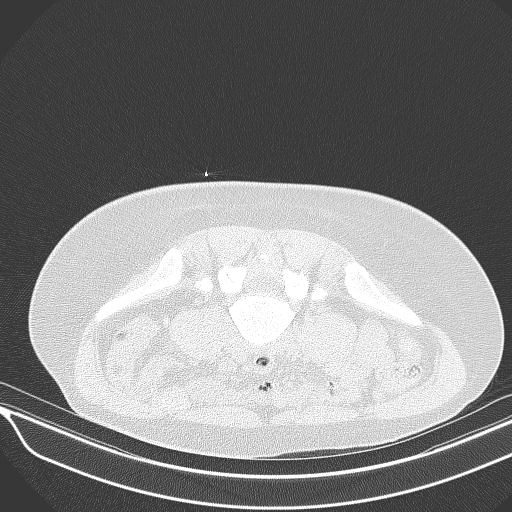
[im 43/50  soft-tissue]
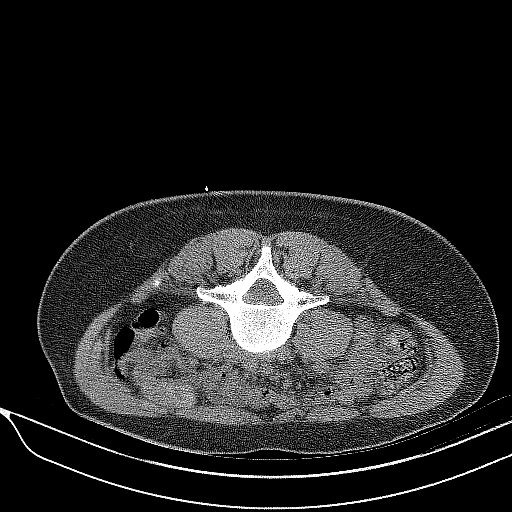
[im 43/50  lung]
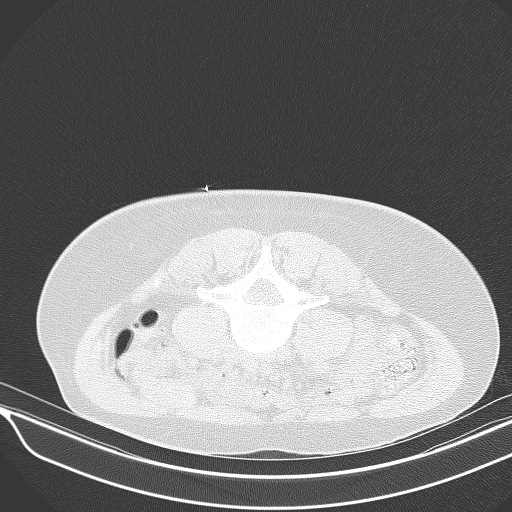

[Series 3: needle -guided injection · axial · 0.74mm/px · z∈[-79,-67]mm · 2 of 20 slices shown (2 of 4)]
[im 7/20  soft-tissue]
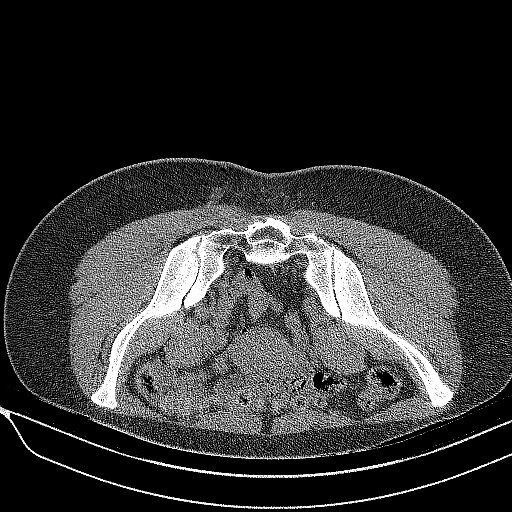
[im 13/20  soft-tissue]
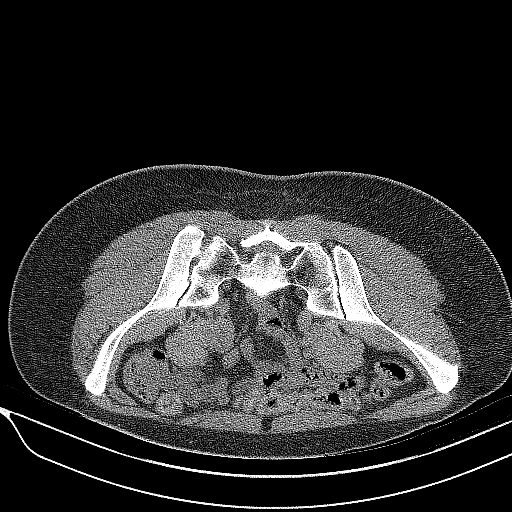

[Series 4: needle -guided injection · axial · 0.74mm/px · z∈[-79,-67]mm · 2 of 20 slices shown (3 of 4)]
[im 7/20  soft-tissue]
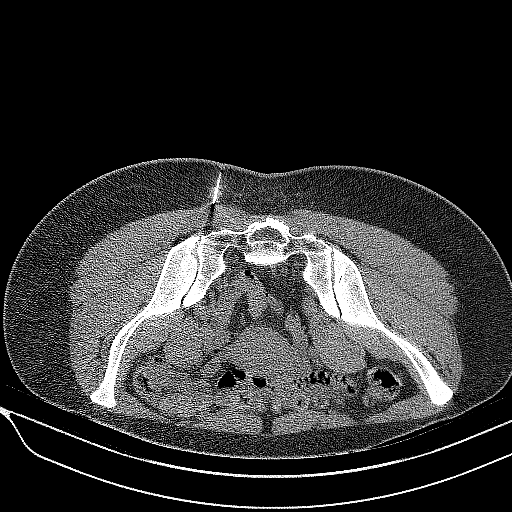
[im 13/20  soft-tissue]
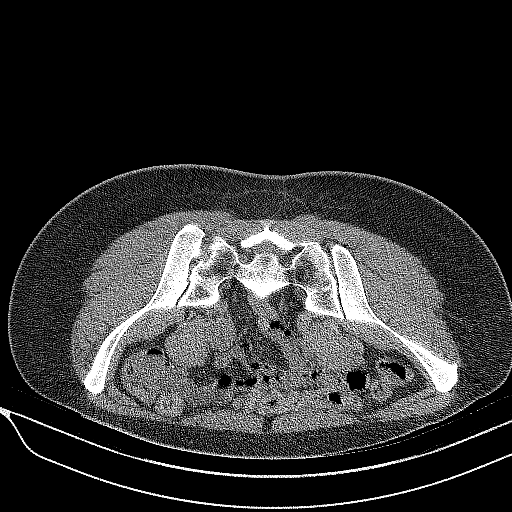

[Series 5: needle -guided injection · axial · 0.74mm/px · z∈[-79,-67]mm · 2 of 20 slices shown (4 of 4)]
[im 7/20  soft-tissue]
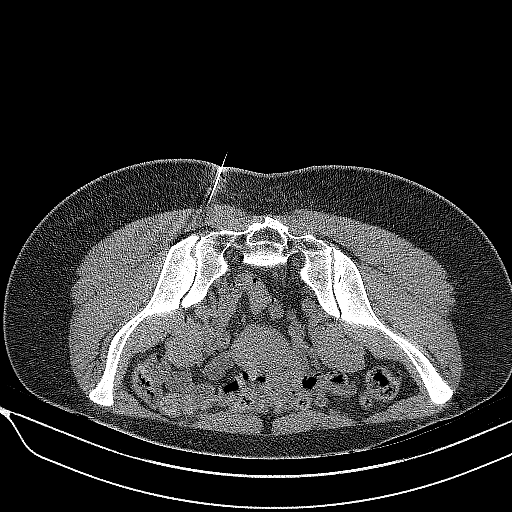
[im 13/20  soft-tissue]
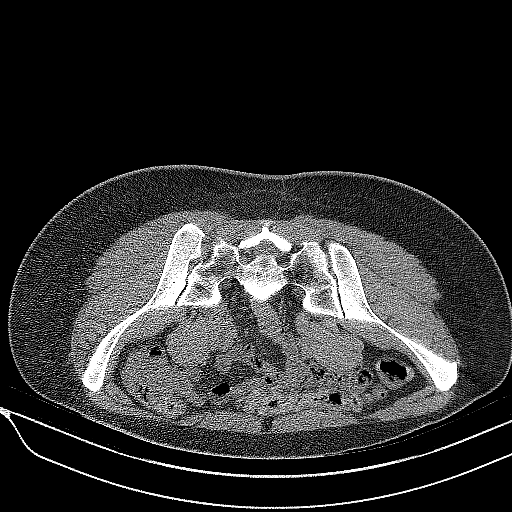

[12 of 32 positions shown; findings below may reference images not displayed]

EXAM:
CT GUIDED RIGHT SI JOINT INJECTION

PROCEDURE:
After a thorough discussion of risks and benefits of the procedure,
including bleeding, infection, injury to nerves, blood vessels, and
adjacent structures, verbal and written consent was obtained. The
patient was placed prone on the CT table and localization was
performed over the sacrum. Target site marked using CT guidance. The
skin was prepped and draped in the usual sterile fashion using
Betadine soap.

After local anesthesia with 1% lidocaine without epinephrine and
subsequent deep anesthesia, a 3.5 inch 22 gauge spinal needle was
advanced into the right SI joint under intermittent CT guidance.

Injection of a small amount of Isovue-M 200 demonstrated
intra-articular contrast flow. Subsequently, 2 mL of 0.5%
bupivacaine were injected into the right SI joint. The needle was
removed and a sterile dressing applied.

No complications were observed.
IMPRESSION: Successful CT-guided right SI joint injection.

## 2021-10-07 ENCOUNTER — Other Ambulatory Visit: Payer: Self-pay | Admitting: Obstetrics

## 2021-10-07 DIAGNOSIS — N943 Premenstrual tension syndrome: Secondary | ICD-10-CM

## 2022-02-18 DIAGNOSIS — Z0000 Encounter for general adult medical examination without abnormal findings: Secondary | ICD-10-CM | POA: Diagnosis not present

## 2022-02-18 DIAGNOSIS — Z23 Encounter for immunization: Secondary | ICD-10-CM | POA: Diagnosis not present

## 2022-02-18 DIAGNOSIS — Z13220 Encounter for screening for lipoid disorders: Secondary | ICD-10-CM | POA: Diagnosis not present

## 2022-02-18 DIAGNOSIS — K5900 Constipation, unspecified: Secondary | ICD-10-CM | POA: Diagnosis not present

## 2022-02-18 DIAGNOSIS — H9191 Unspecified hearing loss, right ear: Secondary | ICD-10-CM | POA: Diagnosis not present

## 2022-02-18 DIAGNOSIS — K649 Unspecified hemorrhoids: Secondary | ICD-10-CM | POA: Diagnosis not present

## 2022-03-05 DIAGNOSIS — F419 Anxiety disorder, unspecified: Secondary | ICD-10-CM | POA: Insufficient documentation

## 2022-03-05 DIAGNOSIS — M79642 Pain in left hand: Secondary | ICD-10-CM | POA: Diagnosis not present

## 2022-03-05 DIAGNOSIS — M5136 Other intervertebral disc degeneration, lumbar region: Secondary | ICD-10-CM | POA: Insufficient documentation

## 2022-03-05 DIAGNOSIS — M545 Low back pain, unspecified: Secondary | ICD-10-CM | POA: Diagnosis not present

## 2022-03-05 DIAGNOSIS — S63236A Subluxation of proximal interphalangeal joint of right little finger, initial encounter: Secondary | ICD-10-CM | POA: Diagnosis not present

## 2022-03-05 DIAGNOSIS — G8929 Other chronic pain: Secondary | ICD-10-CM | POA: Diagnosis not present

## 2022-03-05 DIAGNOSIS — M19041 Primary osteoarthritis, right hand: Secondary | ICD-10-CM | POA: Diagnosis not present

## 2022-03-05 DIAGNOSIS — F32A Depression, unspecified: Secondary | ICD-10-CM | POA: Insufficient documentation

## 2022-03-05 DIAGNOSIS — M2550 Pain in unspecified joint: Secondary | ICD-10-CM | POA: Diagnosis not present

## 2022-03-12 ENCOUNTER — Ambulatory Visit (INDEPENDENT_AMBULATORY_CARE_PROVIDER_SITE_OTHER): Payer: BC Managed Care – PPO | Admitting: Gastroenterology

## 2022-03-12 ENCOUNTER — Encounter: Payer: Self-pay | Admitting: Gastroenterology

## 2022-03-12 VITALS — BP 110/80 | HR 71 | Ht 64.0 in | Wt 153.0 lb

## 2022-03-12 DIAGNOSIS — K6289 Other specified diseases of anus and rectum: Secondary | ICD-10-CM | POA: Diagnosis not present

## 2022-03-12 DIAGNOSIS — K5909 Other constipation: Secondary | ICD-10-CM

## 2022-03-12 DIAGNOSIS — G8929 Other chronic pain: Secondary | ICD-10-CM

## 2022-03-12 DIAGNOSIS — Z800 Family history of malignant neoplasm of digestive organs: Secondary | ICD-10-CM

## 2022-03-12 DIAGNOSIS — L290 Pruritus ani: Secondary | ICD-10-CM | POA: Diagnosis not present

## 2022-03-12 DIAGNOSIS — K5900 Constipation, unspecified: Secondary | ICD-10-CM | POA: Diagnosis not present

## 2022-03-12 MED ORDER — NA SULFATE-K SULFATE-MG SULF 17.5-3.13-1.6 GM/177ML PO SOLN: 1.0000 | Freq: Once | ORAL | 0 refills | Status: AC

## 2022-03-12 NOTE — Progress Notes (Signed)
Assessment    Rectal pain, itching. Suspected hemorrhoidal symptoms  Chronic constipation  Family history of colon cancer, multiple 2nd-degree relatives   Recommendations   Rectal care instructions, witch hazel PR bid prn, RectiCare PR bid prn Miralax qd prn titrated for bowel movement qd to qod without straining Schedule colonoscopy. The risks (including bleeding, perforation, infection, missed lesions, medication reactions and possible hospitalization or surgery if complications occur), benefits, and alternatives to colonoscopy with possible biopsy and possible polypectomy were discussed with the patient and they consent to proceed.     HPI   Chief complaint: Constipation, [facility], rectal itching and burning  Patient profile:  Christina Riley is a 42 y.o. female referred by Donald Prose MD for constipation, rectal itching, rectal burning.  Her constipation symptoms are adequately controlled with MiraLAX qd as needed.  She has a bowel movement about every 2 to 4 days.  She relates persistent problems with rectal itching which has been intermittent in the past but has been ongoing for the past year.  She occasionally has rectal pain/discomfort.  Symptoms are not necessarily exacerbated by bowel movements.  Hydrocortisone cream and hemorrhoidal suppositories have not been effective to reduce or eliminate her symptoms.   Colonoscopy performed in 2016 as below.  She has a paternal GF with colon cancer in his 25s and a paternal aunt with colon cancer in her 55s. Denies weight loss, abdominal pain, diarrhea, change in stool caliber, melena, hematochezia, nausea, vomiting, dysphagia, reflux symptoms, chest pain.    Previous Labs / Imaging::    Latest Ref Rng & Units 03/15/2019   10:56 AM 06/14/2014    1:36 PM 11/25/2011    9:35 AM  CBC  WBC 3.4 - 10.8 x10E3/uL 4.6  8.0  5.2   Hemoglobin 11.1 - 15.9 g/dL 14.0  14.8  11.8   Hematocrit 34.0 - 46.6 % 40.7  43.6  34.7   Platelets 150 - 450  x10E3/uL 236  242  251     No results found for: "LIPASE"    Latest Ref Rng & Units 03/15/2019   10:56 AM 06/14/2014    1:36 PM 11/25/2011    9:35 AM  CMP  Glucose 65 - 99 mg/dL 79  75  91   BUN 6 - 20 mg/dL 10  17  13   $ Creatinine 0.57 - 1.00 mg/dL 0.84  0.79  0.74   Sodium 134 - 144 mmol/L 140  139  140   Potassium 3.5 - 5.2 mmol/L 4.4  4.3  4.3   Chloride 96 - 106 mmol/L 102  101  103   CO2 20 - 29 mmol/L 23  27  28   $ Calcium 8.7 - 10.2 mg/dL 9.4  9.6  7.8   Total Protein 6.0 - 8.5 g/dL 7.4  7.8  6.1   Total Bilirubin 0.0 - 1.2 mg/dL 0.3  0.6  0.2   Alkaline Phos 39 - 117 IU/L 43  46  120   AST 0 - 40 IU/L 18  28  32   ALT 0 - 32 IU/L 16  25  66      Previous GI evaluation    Endoscopies:  Colonoscopy Aug 2016: Mild left colon diverticulosis otherwise normal  Imaging:     Past Medical History:  Diagnosis Date   Anxiety    Depression    GERD (gastroesophageal reflux disease)    with pregnancy   Headache(784.0)    pregnancy   Hyperthyroidism  Past Surgical History:  Procedure Laterality Date   CESAREAN SECTION  2008   TUBAL LIGATION     WISDOM TOOTH EXTRACTION     Family History  Problem Relation Age of Onset   Diabetes Maternal Grandmother    Hypertension Maternal Grandmother    Stroke Maternal Grandmother    Heart disease Maternal Grandmother    Cancer Paternal Grandfather    Hypertension Mother    Breast cancer Mother    Hypertension Brother    Social History   Tobacco Use   Smoking status: Former    Types: Cigarettes    Quit date: 11/14/2010    Years since quitting: 11.3   Smokeless tobacco: Never  Substance Use Topics   Alcohol use: Yes    Alcohol/week: 3.0 standard drinks of alcohol    Types: 3 Shots of liquor per week   Drug use: No   Current Outpatient Medications  Medication Sig Dispense Refill   hydrocortisone (ANUSOL-HC) 25 MG suppository Place 1 suppository (25 mg total) rectally 2 (two) times daily. 20 suppository 0    PARoxetine (PAXIL-CR) 25 MG 24 hr tablet TAKE 1 TABLET (25 MG TOTAL) BY MOUTH DAILY. 30 tablet 11   senna-docusate (SENOKOT-S) 8.6-50 MG per tablet Take 1 tablet by mouth 2 (two) times daily. 60 tablet 6   No current facility-administered medications for this visit.   No Known Allergies  Review of Systems: All other systems reviewed and negative except where noted in HPI.    Physical Exam    Wt Readings from Last 3 Encounters:  03/12/22 153 lb (69.4 kg)  11/07/20 145 lb (65.8 kg)  03/15/19 151 lb (68.5 kg)    Ht 5' 4"$  (1.626 m)   Wt 153 lb (69.4 kg)   BMI 26.26 kg/m  Constitutional:  Generally well appearing female in no acute distress. HEENT: Pupils normal.  Conjunctivae are normal. No scleral icterus. No oral lesions or deformities noted.  Neck: Supple.  Cardiac: Normal rate, regular rhythm without murmurs. Pulmonary/chest: Effort normal and breath sounds normal. No wheezing, rales or rhonchi. Abdominal: Soft, nondistended, nontender. Active bowel sounds. No palpable HSM, masses or hernias. Rectal:  Normal, no lesions, no tenderness, no stool for Hemoccult Extremities: No edema or deformities noted Neurological: Alert and oriented to person, place and time. Psychiatric: Pleasant. Normal mood and affect. Behavior is normal. Skin: Skin is warm and dry. No rashes noted.  Lucio Edward, MD   cc:  Referring Provider Donald Prose, MD

## 2022-03-12 NOTE — Patient Instructions (Addendum)
You have been scheduled for a colonoscopy. Please follow written instructions given to you at your visit today.  Please pick up your prep supplies at the pharmacy within the next 1-3 days. If you use inhalers (even only as needed), please bring them with you on the day of your procedure.   Please increase Miralax leading up to colonoscopy to have regular bowel movements. Please use witch hazel twice daily Please use recticare twice daily. RECTAL CARE INSTRUCTIONS:  1. Sitz Baths twice a day for 10 minutes each. 2. Thoroughly clean and dry the rectum. 3. Put Tucks pad against the rectum at night. 4. Clean the rectum with Balenol lotion after each bowel movement.  _______________________________________________________  If your blood pressure at your visit was 140/90 or greater, please contact your primary care physician to follow up on this.  _______________________________________________________  If you are age 75 or older, your body mass index should be between 23-30. Your Body mass index is 26.26 kg/m. If this is out of the aforementioned range listed, please consider follow up with your Primary Care Provider.  If you are age 44 or younger, your body mass index should be between 19-25. Your Body mass index is 26.26 kg/m. If this is out of the aformentioned range listed, please consider follow up with your Primary Care Provider.   ________________________________________________________  The [facility] providers would like to encourage you to use [facility] to communicate with providers for non-urgent requests or questions.  Due to long hold times on the telephone, sending your provider a message by [facility] may be a faster and more efficient way to get a response.  Please allow 48 business hours for a response.  Please remember that this is for non-urgent requests.  _______________________________________________________ Thank you for trusting me with your gastrointestinal care!      Lucio Edward MD

## 2022-04-02 ENCOUNTER — Encounter: Payer: BC Managed Care – PPO | Admitting: Gastroenterology

## 2022-07-09 ENCOUNTER — Ambulatory Visit: Payer: Medicaid Other | Admitting: Obstetrics

## 2022-07-09 ENCOUNTER — Other Ambulatory Visit (HOSPITAL_COMMUNITY)
Admission: RE | Admit: 2022-07-09 | Discharge: 2022-07-09 | Disposition: A | Discharge status: Home / Self Care | Payer: Medicaid Other | Source: Ambulatory Visit | Attending: Obstetrics | Admitting: Obstetrics

## 2022-07-09 ENCOUNTER — Encounter: Payer: Self-pay | Admitting: Obstetrics

## 2022-07-09 VITALS — BP 120/83 | HR 64 | Ht 64.0 in | Wt 155.0 lb

## 2022-07-09 DIAGNOSIS — F32A Depression, unspecified: Secondary | ICD-10-CM

## 2022-07-09 DIAGNOSIS — N939 Abnormal uterine and vaginal bleeding, unspecified: Secondary | ICD-10-CM

## 2022-07-09 DIAGNOSIS — Z01419 Encounter for gynecological examination (general) (routine) without abnormal findings: Secondary | ICD-10-CM

## 2022-07-09 DIAGNOSIS — Z1239 Encounter for other screening for malignant neoplasm of breast: Secondary | ICD-10-CM

## 2022-07-09 DIAGNOSIS — Z113 Encounter for screening for infections with a predominantly sexual mode of transmission: Secondary | ICD-10-CM

## 2022-07-09 DIAGNOSIS — N898 Other specified noninflammatory disorders of vagina: Secondary | ICD-10-CM

## 2022-07-09 DIAGNOSIS — G43109 Migraine with aura, not intractable, without status migrainosus: Secondary | ICD-10-CM

## 2022-07-09 DIAGNOSIS — F419 Anxiety disorder, unspecified: Secondary | ICD-10-CM

## 2022-07-09 DIAGNOSIS — N943 Premenstrual tension syndrome: Secondary | ICD-10-CM

## 2022-07-09 MED ORDER — FLUOXETINE HCL 20 MG PO CAPS
20.0000 mg | ORAL_CAPSULE | Freq: Every morning | ORAL | 11 refills | Status: DC
Start: 2022-07-09 — End: 2022-08-06

## 2022-07-09 NOTE — Progress Notes (Signed)
Subjective:        Christina Riley is a 43 y.o. female here for a routine exam.  Current complaints: Complains of irregular periods.  Also c/o migraines with aura and severe PMS.  Has recently felt a lump in left breast..    Personal health questionnaire:  Is patient [name], have a family history of breast and/or ovarian cancer: yes Is there a family history of uterine cancer diagnosed at age < 10, gastrointestinal cancer, urinary tract cancer, family member who is a Personnel officer syndrome-associated carrier: yes Is the patient overweight and hypertensive, family history of diabetes, personal history of gestational diabetes, preeclampsia or PCOS: no Is patient over 20, have PCOS,  family history of premature CHD under age 57, diabetes, smoke, have hypertension or peripheral artery disease:  no At any time, has a partner hit, kicked or otherwise hurt or frightened you?: no Over the past 2 weeks, have you felt down, depressed or hopeless?: no Over the past 2 weeks, have you felt little interest or pleasure in doing things?:no   Gynecologic History Patient's last menstrual period was 07/02/2022 (exact date). Contraception: tubal ligation Last Pap: 2/022. Results were: normal Last mammogram: n/a. Results were: n/a  Obstetric History OB History  Gravida Para Term Preterm AB Living  2 2 2     2   SAB IAB Ectopic Multiple Live Births          2    # Outcome Date GA Lbr Len/2nd Weight Sex Delivery Anes PTL Lv  2 Term 11/17/11 [redacted]w[redacted]d  6 lb 5.2 oz (2.87 kg) F CS-LTranv Spinal  LIV  1 Term 2008    M CS-Unspec   LIV    Past Medical History:  Diagnosis Date   Anxiety    Depression    GERD (gastroesophageal reflux disease)    with pregnancy   Headache(784.0)    pregnancy   Hyperthyroidism     Past Surgical History:  Procedure Laterality Date   CESAREAN SECTION  2008   TUBAL LIGATION     WISDOM TOOTH EXTRACTION       Current Outpatient Medications:    PARoxetine  (PAXIL-CR) 25 MG 24 hr tablet, TAKE 1 TABLET (25 MG TOTAL) BY MOUTH DAILY., Disp: 30 tablet, Rfl: 11   hydrocortisone (ANUSOL-HC) 25 MG suppository, Place 1 suppository (25 mg total) rectally 2 (two) times daily. (Patient not taking: Reported on 07/09/2022), Disp: 20 suppository, Rfl: 0   senna-docusate (SENOKOT-S) 8.6-50 MG per tablet, Take 1 tablet by mouth 2 (two) times daily. (Patient not taking: Reported on 07/09/2022), Disp: 60 tablet, Rfl: 6 No Known Allergies  Social History   Tobacco Use   Smoking status: Former    Types: Cigarettes    Quit date: 11/14/2010    Years since quitting: 11.6   Smokeless tobacco: Never  Substance Use Topics   Alcohol use: Yes    Alcohol/week: 3.0 standard drinks of alcohol    Types: 3 Shots of liquor per week    Family History  Problem Relation Age of Onset   Hypertension Mother    Breast cancer Mother    Hypertension Brother    Diabetes Maternal Grandmother    Hypertension Maternal Grandmother    Stroke Maternal Grandmother    Heart disease Maternal Grandmother    Colon cancer Paternal Grandfather    Colon cancer Paternal Aunt       Review of Systems  Constitutional: negative for fatigue and weight loss Respiratory: negative for cough  and wheezing Cardiovascular: negative for chest pain, fatigue and palpitations Gastrointestinal: negative for abdominal pain and change in bowel habits Musculoskeletal:negative for myalgias Neurological: negative for gait problems and tremors Behavioral/Psych: negative for abusive relationship, depression Endocrine: negative for temperature intolerance    Genitourinary:negative fEncounter for gynecological examination wior abnormal menstrual periods, genital lesions, hot flashes, sexual problems and vaginal discharge Integument/breast: negative for breast lump, breast tenderness, nipple discharge and skin lesion(s)    Objective:       BP 120/83   Pulse 64   Ht 5\' 4"  (1.626 m)   Wt 155 lb (70.3 kg)    LMP 07/02/2022 (Exact Date)   Breastfeeding No   BMI 26.61 kg/m  General:   alert  Skin:   no rash or abnormalities  Lungs:   clear to auscultation bilaterally  Heart:   regular rate and rhythm, S1, S2 normal, no murmur, click, rub or gallop  Breasts:   normal without suspicious masses, skin or nipple changes or axillary nodes  Abdomen:  normal findings: no organomegaly, soft, non-tender and no hernia  Pelvis:  External genitalia: normal general appearance Urinary system: urethral meatus normal and bladder without fullness, nontender Vaginal: normal without tenderness, induration or masses Cervix: normal appearance Adnexa: normal bimanual exam Uterus: anteverted and non-tender, normal size   Lab Review Urine pregnancy test Labs reviewed yes Radiologic studies reviewed yes  I have spent a total of 20 minutes of face-to-face time, excluding clinical staff time, reviewing notes and preparing to see patient, ordering tests and/or medications, and counseling the patient.   Assessment:    1. Encounter for gynecological examination with Papanicolaou smear of cervix Rx: - Cytology - PAP( [place])  2. Abnormal uterine bleeding (AUB) Rx: - US PELVIC COMPLETE WITH TRANSVAGINAL; Future  3. Vaginal discharge Rx: - Cervicovaginal ancillary only( [place])  4. Screen for STD (sexually transmitted disease) Rx: - HIV Antibody (routine testing w rflx) - RPR - Hepatitis B Surface AntiGEN - Hepatitis C Antibody  5. PMS (premenstrual syndrome) Rx: - FLUoxetine (PROZAC) 20 MG capsule; Take 1 capsule (20 mg total) by mouth every morning.  Dispense: 30 capsule; Refill: 11  6. Migraine with aura and without status migrainosus, not intractable  7. Anxiety and depression  8. Screening breast examination )Rx: - MM Digital Diagnostic Unilat L; Future - [place] LIMITED ULTRASOUND INCLUDING AXILLA LEFT BREAST ; Future   Plan: Follow up in 2 weeks  Brock Bad, MD 07/09/2022 4:33  PM

## 2022-07-09 NOTE — Progress Notes (Signed)
Patient presents for AEX. Last Pap: 11/07/2020 Last Mammogram: Due after 11/2022 Contraception: Post Tubal- 2013 Vaginal/Urinary Symptoms: STD Screen: Vaginal swab and blood work  Other Concerns: pelvic pain and irregular periods, PMS

## 2022-07-10 LAB — HIV ANTIBODY (ROUTINE TESTING W REFLEX): HIV Screen 4th Generation wRfx: NONREACTIVE

## 2022-07-10 LAB — HEPATITIS B SURFACE ANTIGEN: Hepatitis B Surface Ag: NEGATIVE

## 2022-07-10 LAB — HEPATITIS C ANTIBODY: Hep C Virus Ab: NONREACTIVE

## 2022-07-10 LAB — RPR: RPR Ser Ql: NONREACTIVE

## 2022-07-11 LAB — CERVICOVAGINAL ANCILLARY ONLY
Bacterial Vaginitis (gardnerella): NEGATIVE
Candida Glabrata: NEGATIVE
Candida Vaginitis: NEGATIVE
Chlamydia: NEGATIVE
Comment: NEGATIVE
Comment: NEGATIVE
Comment: NEGATIVE
Comment: NEGATIVE
Comment: NEGATIVE
Comment: NORMAL
Neisseria Gonorrhea: NEGATIVE
Trichomonas: NEGATIVE

## 2022-07-11 LAB — CYTOLOGY - PAP
Comment: NEGATIVE
Diagnosis: NEGATIVE
High risk HPV: NEGATIVE

## 2022-07-16 ENCOUNTER — Ambulatory Visit (AMBULATORY_SURGERY_CENTER): Payer: Medicaid Other

## 2022-07-16 ENCOUNTER — Ambulatory Visit
Admission: RE | Admit: 2022-07-16 | Discharge: 2022-07-16 | Disposition: A | Discharge status: Home / Self Care | Payer: Medicaid Other | Source: Ambulatory Visit | Attending: Obstetrics | Admitting: Obstetrics

## 2022-07-16 VITALS — Ht 64.0 in | Wt 155.0 lb

## 2022-07-16 DIAGNOSIS — N939 Abnormal uterine and vaginal bleeding, unspecified: Secondary | ICD-10-CM

## 2022-07-16 DIAGNOSIS — Z800 Family history of malignant neoplasm of digestive organs: Secondary | ICD-10-CM

## 2022-07-16 MED ORDER — NA SULFATE-K SULFATE-MG SULF 17.5-3.13-1.6 GM/177ML PO SOLN
1.0000 | Freq: Once | ORAL | 0 refills | Status: AC
Start: 2022-07-16 — End: 2022-07-16

## 2022-07-16 NOTE — Progress Notes (Signed)
No egg or soy allergy known to patient  No issues known to pt with past sedation with any surgeries or procedures Patient denies ever being told they had issues or difficulty with intubation  No FH of Malignant Hyperthermia Pt is not on diet pills Pt is not on  home 02  Pt is not on blood thinners  Pt reports occ constipation  No A fib or A flutter Have any cardiac testing pending--no  LOA: independent  Prep:  suprep extra miralax x 7 days    Patient's chart reviewed by Cathlyn Parsons CNRA prior to previsit and patient appropriate for the [facility].  Previsit completed and red dot placed by patient's name on their procedure day (on provider's schedule).     PV competed with patient. Prep instructions sent via mychart and home address. Goodrx coupon for [employer] provided to use for price reduction if needed.

## 2022-08-06 ENCOUNTER — Ambulatory Visit: Payer: Medicaid Other | Admitting: Gastroenterology

## 2022-08-06 ENCOUNTER — Encounter: Payer: Self-pay | Admitting: Gastroenterology

## 2022-08-06 VITALS — BP 109/60 | HR 60 | Temp 98.4°F | Resp 14 | Ht 64.0 in

## 2022-08-06 DIAGNOSIS — Z800 Family history of malignant neoplasm of digestive organs: Secondary | ICD-10-CM

## 2022-08-06 DIAGNOSIS — Z1211 Encounter for screening for malignant neoplasm of colon: Secondary | ICD-10-CM | POA: Diagnosis not present

## 2022-08-06 MED ORDER — SODIUM CHLORIDE 0.9 % IV SOLN: 500.0000 mL | INTRAVENOUS | Status: DC

## 2022-08-06 NOTE — Op Note (Signed)
[facility] Patient Name: Christina Riley Procedure Date: 08/06/2022 10:58 AM MRN: [id number] Endoscopist: Meryl Dare , MD, [phone] Age: 42 Referring MD:  Date of Birth: September 03, 1980 Gender: Female Account #: [id number] Procedure:                Colonoscopy Indications:              Screening for colon cancer: Family history of                            colorectal cancer in multiple 2nd degree relatives Medicines:                Monitored Anesthesia Care Procedure:                Pre-Anesthesia Assessment:                           - Prior to the procedure, a History and Physical                            was performed, and patient medications and                            allergies were reviewed. The patient's tolerance of                            previous anesthesia was also reviewed. The risks                            and benefits of the procedure and the sedation                            options and risks were discussed with the patient.                            All questions were answered, and informed consent                            was obtained. Prior Anticoagulants: The patient has                            taken no anticoagulant or antiplatelet agents. ASA                            Grade Assessment: II - A patient with mild systemic                            disease. After reviewing the risks and benefits,                            the patient was deemed in satisfactory condition to                            undergo the procedure.  After obtaining informed consent, the colonoscope                            was passed under direct vision. Throughout the                            procedure, the patient's blood pressure, pulse, and                            oxygen saturations were monitored continuously. The                            Olympus PCF-[id number] (#[id number]) Colonoscope was                             introduced through the anus and advanced to the the                            cecum, identified by appendiceal orifice and                            ileocecal valve. The ileocecal valve, appendiceal                            orifice, and rectum were photographed. The quality                            of the bowel preparation was good. The colonoscopy                            was performed without difficulty. The patient                            tolerated the procedure well. Scope In: 11:08:40 AM Scope Out: 11:29:05 AM Scope Withdrawal Time: 0 hours 11 minutes 30 seconds  Total Procedure Duration: 0 hours 20 minutes 25 seconds  Findings:                 The perianal and digital rectal examinations were                            normal.                           A few small-mouthed diverticula were found in the                            left colon.                           The exam was otherwise without abnormality on                            direct and retroflexion views. Complications:            No immediate complications. Estimated blood loss:  None. Estimated Blood Loss:     Estimated blood loss: none. Impression:               - Mild diverticulosis in the left colon.                           - The examination was otherwise normal on direct                            and retroflexion views.                           - No specimens collected. Recommendation:           - Repeat colonoscopy in 5 years for screening                            purposes.                           - Patient has a contact number available for                            emergencies. The signs and symptoms of potential                            delayed complications were discussed with the                            patient. Return to normal activities tomorrow.                            Written discharge instructions were provided to the                             patient.                           - High fiber diet.                           - Continue present medications. Meryl Dare, MD 08/06/2022 11:34:27 AM This report has been signed electronically.

## 2022-08-06 NOTE — Progress Notes (Signed)
See 07/09/2022 H&P no changes

## 2022-08-06 NOTE — Progress Notes (Signed)
IV Zofran given. Uneventful propofol anesthetic. Report to pacu rn. Vss. Care resumed by rn.

## 2022-08-06 NOTE — Patient Instructions (Signed)
Discharge instructions given. Handout on Diverticulosis. Resume previous medications. YOU HAD AN ENDOSCOPIC PROCEDURE TODAY AT THE [facility]:   Refer to the procedure report that was given to you for any specific questions about what was found during the examination.  If the procedure report does not answer your questions, please call your gastroenterologist to clarify.  If you requested that your care partner not be given the details of your procedure findings, then the procedure report has been included in a sealed envelope for you to review at your convenience later.  YOU SHOULD EXPECT: Some feelings of bloating in the abdomen. Passage of more gas than usual.  Walking can help get rid of the air that was put into your GI tract during the procedure and reduce the bloating. If you had a lower endoscopy (such as a colonoscopy or flexible sigmoidoscopy) you may notice spotting of blood in your stool or on the toilet paper. If you underwent a bowel prep for your procedure, you may not have a normal bowel movement for a few days.  Please Note:  You might notice some irritation and congestion in your nose or some drainage.  This is from the oxygen used during your procedure.  There is no need for concern and it should clear up in a day or so.  SYMPTOMS TO REPORT IMMEDIATELY:  Following lower endoscopy (colonoscopy or flexible sigmoidoscopy):  Excessive amounts of blood in the stool  Significant tenderness or worsening of abdominal pains  Swelling of the abdomen that is new, acute  Fever of 100F or higher   For urgent or emergent issues, a gastroenterologist can be reached at any hour by calling [phone]. Do not use MyChart messaging for urgent concerns.    DIET:  We do recommend a small meal at first, but then you may proceed to your regular diet.  Drink plenty of fluids but you should avoid alcoholic beverages for 24 hours.  ACTIVITY:  You should plan to take it easy for  the rest of today and you should NOT DRIVE or use heavy machinery until tomorrow (because of the sedation medicines used during the test).    FOLLOW UP: Our staff will call the number listed on your records the next business day following your procedure.  We will call around 7:15- 8:00 am to check on you and address any questions or concerns that you may have regarding the information given to you following your procedure. If we do not reach you, we will leave a message.     If any biopsies were taken you will be contacted by phone or by letter within the next 1-3 weeks.  Please call us at [phone] if you have not heard about the biopsies in 3 weeks.    SIGNATURES/CONFIDENTIALITY: You and/or your care partner have signed paperwork which will be entered into your electronic medical record.  These signatures attest to the fact that that the information above on your After Visit Summary has been reviewed and is understood.  Full responsibility of the confidentiality of this discharge information lies with you and/or your care-partner.  

## 2022-08-07 ENCOUNTER — Telehealth: Payer: Self-pay

## 2022-08-07 NOTE — Telephone Encounter (Signed)
  Follow up Call-     08/06/2022    9:48 AM  Call back number  Post procedure Call Back phone  # [phone]  Permission to leave phone message Yes     Patient questions:  Do you have a fever, pain , or abdominal swelling? No. Pain Score  0 *  Have you tolerated food without any problems? Yes.    Have you been able to return to your normal activities? Yes.    Do you have any questions about your discharge instructions: Diet   No. Medications  No. Follow up visit  No.  Do you have questions or concerns about your Care? No.  Actions: * If pain score is 4 or above: No action needed, pain <4.

## 2022-08-29 ENCOUNTER — Other Ambulatory Visit: Payer: Self-pay | Admitting: Obstetrics

## 2022-08-29 DIAGNOSIS — Z113 Encounter for screening for infections with a predominantly sexual mode of transmission: Secondary | ICD-10-CM

## 2022-08-29 DIAGNOSIS — Z1239 Encounter for other screening for malignant neoplasm of breast: Secondary | ICD-10-CM

## 2022-08-29 DIAGNOSIS — F32A Depression, unspecified: Secondary | ICD-10-CM

## 2022-08-29 DIAGNOSIS — N943 Premenstrual tension syndrome: Secondary | ICD-10-CM

## 2022-08-29 DIAGNOSIS — Z01419 Encounter for gynecological examination (general) (routine) without abnormal findings: Secondary | ICD-10-CM

## 2022-08-29 DIAGNOSIS — N939 Abnormal uterine and vaginal bleeding, unspecified: Secondary | ICD-10-CM

## 2022-08-29 DIAGNOSIS — N6320 Unspecified lump in the left breast, unspecified quadrant: Secondary | ICD-10-CM

## 2022-08-29 DIAGNOSIS — N898 Other specified noninflammatory disorders of vagina: Secondary | ICD-10-CM

## 2022-08-29 DIAGNOSIS — G43109 Migraine with aura, not intractable, without status migrainosus: Secondary | ICD-10-CM

## 2022-09-10 ENCOUNTER — Ambulatory Visit
Admission: RE | Admit: 2022-09-10 | Discharge: 2022-09-10 | Disposition: A | Discharge status: Home / Self Care | Payer: Medicaid Other | Source: Ambulatory Visit | Attending: Obstetrics | Admitting: Obstetrics

## 2022-09-10 ENCOUNTER — Ambulatory Visit: Admission: RE | Admit: 2022-09-10 | Payer: Medicaid Other | Source: Ambulatory Visit

## 2022-09-10 ENCOUNTER — Other Ambulatory Visit: Payer: Self-pay | Admitting: Obstetrics

## 2022-09-10 DIAGNOSIS — Z1239 Encounter for other screening for malignant neoplasm of breast: Secondary | ICD-10-CM

## 2022-09-10 DIAGNOSIS — N6320 Unspecified lump in the left breast, unspecified quadrant: Secondary | ICD-10-CM

## 2022-09-10 DIAGNOSIS — N6310 Unspecified lump in the right breast, unspecified quadrant: Secondary | ICD-10-CM

## 2022-09-17 ENCOUNTER — Other Ambulatory Visit: Payer: Medicaid Other

## 2022-10-08 ENCOUNTER — Encounter: Payer: Self-pay | Admitting: Dermatology

## 2022-10-08 ENCOUNTER — Ambulatory Visit (INDEPENDENT_AMBULATORY_CARE_PROVIDER_SITE_OTHER): Payer: Medicaid Other | Admitting: Dermatology

## 2022-10-08 VITALS — BP 139/89 | HR 83

## 2022-10-08 DIAGNOSIS — Z1283 Encounter for screening for malignant neoplasm of skin: Secondary | ICD-10-CM | POA: Diagnosis not present

## 2022-10-08 DIAGNOSIS — D1801 Hemangioma of skin and subcutaneous tissue: Secondary | ICD-10-CM

## 2022-10-08 DIAGNOSIS — D235 Other benign neoplasm of skin of trunk: Secondary | ICD-10-CM

## 2022-10-08 DIAGNOSIS — D229 Melanocytic nevi, unspecified: Secondary | ICD-10-CM

## 2022-10-08 DIAGNOSIS — L219 Seborrheic dermatitis, unspecified: Secondary | ICD-10-CM

## 2022-10-08 DIAGNOSIS — L578 Other skin changes due to chronic exposure to nonionizing radiation: Secondary | ICD-10-CM | POA: Diagnosis not present

## 2022-10-08 DIAGNOSIS — L814 Other melanin hyperpigmentation: Secondary | ICD-10-CM | POA: Diagnosis not present

## 2022-10-08 DIAGNOSIS — L820 Inflamed seborrheic keratosis: Secondary | ICD-10-CM

## 2022-10-08 DIAGNOSIS — W908XXA Exposure to other nonionizing radiation, initial encounter: Secondary | ICD-10-CM

## 2022-10-08 DIAGNOSIS — D485 Neoplasm of uncertain behavior of skin: Secondary | ICD-10-CM

## 2022-10-08 DIAGNOSIS — D2272 Melanocytic nevi of left lower limb, including hip: Secondary | ICD-10-CM | POA: Diagnosis not present

## 2022-10-08 DIAGNOSIS — L821 Other seborrheic keratosis: Secondary | ICD-10-CM

## 2022-10-08 MED ORDER — CLOBETASOL PROPIONATE 0.05 % EX SOLN: CUTANEOUS | 3 refills | Status: AC

## 2022-10-08 NOTE — Patient Instructions (Addendum)
Hello Christina Riley,  Thank you for visiting [place] today for your full body skin cancer screening. We value your dedication to maintaining your skin health. Below is a summary of the key instructions and recommendations from your visit:  - Medications Prescribed:   - DHS Zinc Shampoo: Use daily or every other day.(Purchase this from [facility] or [employer])   - Clobetasol Drops: Apply one to two times a day as needed for itching or tenderness.  - Post-Procedure Care:   - Biopsy Sites: Keep the areas clean with soap and water. Apply Aquaphor and a Band-Aid daily.   - Cryotherapy Treated Areas: Apply Aquaphor or Vaseline.  - Biopsy Procedures Performed:   - Shave biopsy on the chest and right posterior thigh to rule out dysplastic nevus.  - Lifestyle and Skin Care Recommendations:   - Ensure diligent use of sunscreen every day on the face, neck, and chest.   - Avoid sun exposure and wear hats for additional protection.   - Reapply sunscreen every three hours when in the sun for extended periods.  - Follow-Up:   - Results of your biopsies will be communicated via MyChart. We will call you if there are any abnormal findings.  We encourage you to reach out if you have any questions or need further assistance. Looking forward to your continued health and well-being.  Best regards,  Dr. [facility]           Cryotherapy Aftercare  Wash gently with soap and water everyday.   Apply Vaseline and Band-Aid daily until healed.   Patient Handout: Wound Care for Skin Biopsy Site  Taking Care of Your Skin Biopsy Site  Proper care of the biopsy site is essential for promoting healing and minimizing scarring. This handout provides instructions on how to care for your biopsy site to ensure optimal recovery.  1. Cleaning the Wound:  Clean the biopsy site daily with gentle soap and water. Gently pat the area dry with a clean, soft towel. Avoid harsh scrubbing or rubbing the area, as  this can irritate the skin and delay healing.  2. Applying Aquaphor and Bandage:  After cleaning the wound, apply a thin layer of Aquaphor ointment to the biopsy site. Cover the area with a sterile bandage to protect it from dirt, bacteria, and friction. Change the bandage daily or as needed if it becomes soiled or wet.  3. Continued Care for One Week:  Repeat the cleaning, Aquaphor application, and bandaging process daily for one week following the biopsy procedure. Keeping the wound clean and moist during this initial healing period will help prevent infection and promote optimal healing.  4. Massaging Aquaphor into the Area:  ---After one week, discontinue the use of bandages but continue to apply Aquaphor to the biopsy site. ----Gently massage the Aquaphor into the area using circular motions. ---Massaging the skin helps to promote circulation and prevent the formation of scar tissue.   Additional Tips:  Avoid exposing the biopsy site to direct sunlight during the healing process, as this can cause hyperpigmentation or worsen scarring. If you experience any signs of infection, such as increased redness, swelling, warmth, or drainage from the wound, contact your healthcare provider immediately. Follow any additional instructions provided by your healthcare provider for caring for the biopsy site and managing any discomfort. Conclusion:  Taking proper care of your skin biopsy site is crucial for ensuring optimal healing and minimizing scarring. By following these instructions for cleaning, applying Aquaphor, and massaging the area,  you can promote a smooth and successful recovery. If you have any questions or concerns about caring for your biopsy site, don't hesitate to contact your healthcare provider for guidance.    Important Information   Due to recent changes in healthcare laws, you may see results of your pathology and/or laboratory studies on MyChart before the doctors have  had a chance to review them. We understand that in some cases there may be results that are confusing or concerning to you. Please understand that not all results are received at the same time and often the doctors may need to interpret multiple results in order to provide you with the best plan of care or course of treatment. Therefore, we ask that you please give [place] 2 business days to thoroughly review all your results before contacting the office for clarification. Should we see a critical lab result, you will be contacted sooner.     If You Need Anything After Your Visit   If you have any questions or concerns for your doctor, please call our main line at [phone]. If no one answers, please leave a voicemail as directed and we will return your call as soon as possible. Messages left after 4 pm will be answered the following business day.    You may also send [place] a message via MyChart. We typically respond to MyChart messages within 1-2 business days.  For prescription refills, please ask your pharmacy to contact our office. Our fax number is [phone].  If you have an urgent issue when the clinic is closed that cannot wait until the next business day, you can page your doctor at the number below.     Please note that while we do our best to be available for urgent issues outside of office hours, we are not available 24/7.    If you have an urgent issue and are unable to reach [place], you may choose to seek medical care at your doctor's office, retail clinic, urgent care center, or emergency room.   If you have a medical emergency, please immediately call 911 or go to the emergency department. In the event of inclement weather, please call our main line at [phone] for an update on the status of any delays or closures.  Dermatology Medication Tips: Please keep the boxes that topical medications come in in order to help keep track of the instructions about where and how to use these.  Pharmacies typically print the medication instructions only on the boxes and not directly on the medication tubes.   If your medication is too expensive, please contact our office at [phone] or send [place] a message through MyChart.    We are unable to tell what your co-pay for medications will be in advance as this is different depending on your insurance coverage. However, we may be able to find a substitute medication at lower cost or fill out paperwork to get insurance to cover a needed medication.    If a prior authorization is required to get your medication covered by your insurance company, please allow [place] 1-2 business days to complete this process.   Drug prices often vary depending on where the prescription is filled and some pharmacies may offer cheaper prices.   The website www.goodrx.com contains coupons for medications through different pharmacies. The prices here do not account for what the cost may be with help from insurance (it may be cheaper with your insurance), but the website can give you the price if  you did not use any insurance.  - You can print the associated coupon and take it with your prescription to the pharmacy.  - You may also stop by our office during regular business hours and pick up a GoodRx coupon card.  - If you need your prescription sent electronically to a different pharmacy, notify our office through [facility] or by phone at [phone]

## 2022-10-08 NOTE — Progress Notes (Signed)
New Patient Visit   Subjective  Christina Riley is a 42 y.o. female who presents for the following: Skin Cancer Screening and Full Body Skin Exam  The patient presents for Total-Body Skin Exam (TBSE) for skin cancer screening and mole check. The patient has spots, moles and lesions to be evaluated, some may be new or changing and the patient may have concern these could be cancer. Pt's last skin check was around 15 years ago. She has no hx of skin cancer. No family hx of skin cancer. Pt uses sunscreen sometimes Pt has several areas she'd like to point out. One of the spots was removed when she was a child and she doesn't know what it was but has grown back as she describes, oddly.   The following portions of the chart were reviewed this encounter and updated as appropriate: medications, allergies, medical history  Review of Systems:  No other skin or systemic complaints except as noted in HPI or Assessment and Plan.  Objective  Well appearing patient in no apparent distress; mood and affect are within normal limits.  A full examination was performed including scalp, head, eyes, ears, nose, lips, neck, chest, axillae, abdomen, back, buttocks, bilateral upper extremities, bilateral lower extremities, hands, feet, fingers, toes, fingernails, and toenails. All findings within normal limits unless otherwise noted below.   Relevant physical exam findings are noted in the Assessment and Plan.  mid chest 6mm irregular brown macule        left posterior thigh Irregular dark brown macule       Exam: Pink patches with greasy scale at scalp  Assessment & Plan   SKIN CANCER SCREENING PERFORMED TODAY.  ACTINIC DAMAGE - Chronic condition, secondary to cumulative UV/sun exposure - diffuse scaly erythematous macules with underlying dyspigmentation - Recommend daily broad spectrum sunscreen SPF 30+ to sun-exposed areas, reapply every 2 hours as needed.  - Staying in the shade  or wearing long sleeves, sun glasses (UVA+UVB protection) and wide brim hats (4-inch brim around the entire circumference of the hat) are also recommended for sun protection.  - Call for new or changing lesions.  MELANOCYTIC NEVI - Tan-brown and/or pink-flesh-colored symmetric macules and papules - Benign appearing on exam today - Observation - Call clinic for new or changing moles - Recommend daily use of broad spectrum spf 30+ sunscreen to sun-exposed areas.   LENTIGINES Exam: scattered tan macules Due to sun exposure Treatment Plan: Benign-appearing, observe. Recommend daily broad spectrum sunscreen SPF 30+ to sun-exposed areas, reapply every 2 hours as needed.  Call for any changes   SEBORRHEIC KERATOSIS - Stuck-on, waxy, tan-brown papules and/or plaques  - Benign-appearing - Discussed benign etiology and prognosis. - Observe - Call for any changes  HEMANGIOMA Exam: red papule(s) Discussed benign nature. Recommend observation. Call for changes.    SEBORRHEIC DERMATITIS Chronic and persistent condition with duration or expected duration over one year. Condition is bothersome/symptomatic for patient. Currently flared.   Seborrheic Dermatitis is a chronic persistent rash characterized by pinkness and scaling most commonly of the mid face but also can occur on the scalp (dandruff), ears; mid chest, mid back and groin.  It tends to be exacerbated by stress and cooler weather.  People who have neurologic disease may experience new onset or exacerbation of existing seborrheic dermatitis.  The condition is not curable but treatable and can be controlled.  Treatment Plan: -Clobetasol solution to scalp daily    Intradermal Nevus on back x2 Exam: brown macule  Treatment Plan: Observe    Neoplasm of uncertain behavior of skin (2) mid chest  Skin / nail biopsy Type of biopsy: tangential   Informed consent: discussed and consent obtained   Timeout: patient name, date of birth,  surgical site, and procedure verified   Procedure prep:  Patient was prepped and draped in usual sterile fashion Prep type:  Isopropyl alcohol Anesthesia: the lesion was anesthetized in a standard fashion   Anesthetic:  1% lidocaine w/ epinephrine 1-100,000 buffered w/ 8.4% NaHCO3 Instrument used: DermaBlade   Hemostasis achieved with: aluminum chloride   Outcome: patient tolerated procedure well   Post-procedure details: sterile dressing applied and wound care instructions given   Dressing type: petrolatum gauze and bandage    Specimen 1 - Surgical pathology Differential Diagnosis: R/O DN vs other  Check Margins: No  left posterior thigh  Skin / nail biopsy Type of biopsy: tangential   Informed consent: discussed and consent obtained   Timeout: patient name, date of birth, surgical site, and procedure verified   Procedure prep:  Patient was prepped and draped in usual sterile fashion Prep type:  Isopropyl alcohol Anesthesia: the lesion was anesthetized in a standard fashion   Anesthetic:  1% lidocaine w/ epinephrine 1-100,000 buffered w/ 8.4% NaHCO3 Instrument used: DermaBlade   Hemostasis achieved with: aluminum chloride   Outcome: patient tolerated procedure well   Post-procedure details: sterile dressing applied and wound care instructions given   Dressing type: petrolatum gauze and bandage    Specimen 2 - Surgical pathology Differential Diagnosis: R/O DN   Check Margins: No  Inflamed seborrheic keratosis (4) left jaw, right lower leg  Destruction of lesion - left jaw, right lower leg (4) Complexity: simple   Destruction method: cryotherapy   Informed consent: discussed and consent obtained   Timeout:  patient name, date of birth, surgical site, and procedure verified Lesion destroyed using liquid nitrogen: Yes   Post-procedure details: wound care instructions given      Return in about 1 year (around 10/08/2023) for TBSE.  Owens Shark, CMA, am acting as  scribe for [employer], DO.   Documentation: I have reviewed the above documentation for accuracy and completeness, and I agree with the above.  Langston Reusing, DO

## 2022-10-09 LAB — SURGICAL PATHOLOGY

## 2022-10-09 NOTE — Progress Notes (Signed)
Hi Christina Riley  Dr. Onalee Hua reviewed your biopsy results and they showed on of the spots removed was a little "abnormal" but not cancerous and the margins were clear.  No additional treatment is required.  We will continue to monitor the area for re-pigmentation during your annual skin exams. The detailed report is available to view in MyChart.  Have a great day!  Kind Regards,  Dr. Kermit Balo Care Team

## 2023-10-07 ENCOUNTER — Other Ambulatory Visit: Payer: Self-pay | Admitting: Obstetrics

## 2023-10-07 DIAGNOSIS — N943 Premenstrual tension syndrome: Secondary | ICD-10-CM

## 2023-10-08 ENCOUNTER — Ambulatory Visit: Payer: Medicaid Other | Admitting: Dermatology

## 2023-10-30 ENCOUNTER — Ambulatory Visit: Admitting: Obstetrics

## 2023-11-16 ENCOUNTER — Ambulatory Visit: Admitting: Obstetrics

## 2023-11-16 ENCOUNTER — Encounter: Payer: Self-pay | Admitting: Obstetrics

## 2023-11-16 ENCOUNTER — Other Ambulatory Visit: Payer: Self-pay | Admitting: Obstetrics

## 2023-11-16 VITALS — BP 112/73 | HR 64 | Ht 64.0 in | Wt 152.0 lb

## 2023-11-16 DIAGNOSIS — Z0000 Encounter for general adult medical examination without abnormal findings: Secondary | ICD-10-CM

## 2023-11-16 DIAGNOSIS — Z01419 Encounter for gynecological examination (general) (routine) without abnormal findings: Secondary | ICD-10-CM | POA: Diagnosis not present

## 2023-11-16 DIAGNOSIS — N6320 Unspecified lump in the left breast, unspecified quadrant: Secondary | ICD-10-CM

## 2023-11-16 NOTE — Progress Notes (Signed)
 Subjective:        Christina Riley is a 43 y.o. female here for a routine exam.  Current complaints: None.    Personal health questionnaire:  Is patient Christina Riley, have a family history of breast and/or ovarian cancer: yes Is there a family history of uterine cancer diagnosed at age < 24, gastrointestinal cancer, urinary tract cancer, family member who is a Personnel Officer syndrome-associated carrier: yes Is the patient overweight and hypertensive, family history of diabetes, personal history of gestational diabetes, preeclampsia or PCOS: no Is patient over 75, have PCOS,  family history of premature CHD under age 97, diabetes, smoke, have hypertension or peripheral artery disease:  no At any time, has a partner hit, kicked or otherwise hurt or frightened you?: no Over the past 2 weeks, have you felt down, depressed or hopeless?: no Over the past 2 weeks, have you felt little interest or pleasure in doing things?:no   Gynecologic History Patient's last menstrual period was 11/02/2023 (approximate). Contraception: tubal ligation Last Pap: 07-09-2022. Results were: normal Last mammogram: 09-10-2022. Results were: normal  Obstetric History OB History  Gravida Para Term Preterm AB Living  2 2 2   2   SAB IAB Ectopic Multiple Live Births      2    # Outcome Date GA Lbr Len/2nd Weight Sex Type Anes PTL Lv  2 Term 11/17/11 [redacted]w[redacted]d  6 lb 5.2 oz (2.87 kg) F CS-LTranv Spinal  LIV  1 Term 2008    M CS-Unspec   LIV    Past Medical History:  Diagnosis Date   Anxiety    Depression    GERD (gastroesophageal reflux disease)    with pregnancy   Headache(784.0)    pregnancy   Hyperthyroidism     Past Surgical History:  Procedure Laterality Date   CESAREAN SECTION  2008   TUBAL LIGATION     WISDOM TOOTH EXTRACTION       Current Outpatient Medications:    Biotin 1 MG CAPS, Take by mouth., Disp: , Rfl:    clobetasol  (TEMOVATE ) 0.05 % external solution, Apply once a day as needed  for flares, Disp: 50 mL, Rfl: 3   FLUoxetine  (PROZAC ) 20 MG capsule, TAKE 1 CAPSULE(20 MG) BY MOUTH EVERY MORNING, Disp: 30 capsule, Rfl: 11   Multiple Vitamin (MULTIVITAMIN) LIQD, Take 5 mLs by mouth daily., Disp: , Rfl:  No Known Allergies  Social History   Tobacco Use   Smoking status: Former    Current packs/day: 0.00    Types: Cigarettes    Quit date: 11/14/2010    Years since quitting: 13.0   Smokeless tobacco: Never  Substance Use Topics   Alcohol use: Not Currently    Comment: occasional    Family History  Problem Relation Age of Onset   Hypertension Mother    Breast cancer Mother    Hypertension Brother    Colon cancer Paternal Aunt    Diabetes Maternal Grandmother    Hypertension Maternal Grandmother    Stroke Maternal Grandmother    Heart disease Maternal Grandmother    Colon cancer Paternal Grandfather    Colon polyps Neg Hx    Esophageal cancer Neg Hx    Rectal cancer Neg Hx    Stomach cancer Neg Hx       Review of Systems  Constitutional: negative for fatigue and weight loss Respiratory: negative for cough and wheezing Cardiovascular: negative for chest pain, fatigue and palpitations Gastrointestinal: negative for abdominal pain and change  in bowel habits Musculoskeletal:negative for myalgias Neurological: negative for gait problems and tremors Behavioral/Psych: negative for abusive relationship, depression Endocrine: negative for temperature intolerance    Genitourinary:negative for abnormal menstrual periods, genital lesions, hot flashes, sexual problems and vaginal discharge Integument/breast: negative for breast lump, breast tenderness, nipple discharge and skin lesion(s)    Objective:       BP 112/73   Pulse 64   Ht 5' 4 (1.626 m)   Wt 152 lb (68.9 kg)   LMP 11/02/2023 (Approximate)   BMI 26.09 kg/m  General:   Alert and no distress  Skin:   no rash or abnormalities  Lungs:   clear to auscultation bilaterally  Heart:   regular rate and  rhythm, S1, S2 normal, no murmur, click, rub or gallop  Breasts:   normal without suspicious masses, skin or nipple changes or axillary nodes  Abdomen:  normal findings: no organomegaly, soft, non-tender and no hernia  Pelvis:  External genitalia: normal general appearance Urinary system: urethral meatus normal and bladder without fullness, nontender Vaginal: normal without tenderness, induration or masses Cervix: normal appearance Adnexa: normal bimanual exam Uterus: anteverted and non-tender, normal size   Lab Review Urine pregnancy test Labs reviewed yes Radiologic studies reviewed yes  I have spent a total of 20 minutes of face-to-face time, excluding clinical staff time, reviewing notes and preparing to see patient, ordering tests and/or medications, and counseling the patient.   Assessment:   1. Encounter for gynecological examination with Papanicolaou smear of cervix (Primary) Rx: - Cytology - PAP( [facility])  2. Mass of left breast, unspecified quadrant Rx: - MM 3D DIAGNOSTIC MAMMOGRAM BILATERAL BREAST; Future - US  LIMITED ULTRASOUND INCLUDING AXILLA RIGHT BREAST; Future - US  LIMITED ULTRASOUND INCLUDING AXILLA LEFT BREAST ; Future  3. Healthcare maintenance Rx: - Ambulatory referral to Internal Medicine    Plan:    Education reviewed: calcium supplements, depression evaluation, low fat, low cholesterol diet, safe sex/STD prevention, self breast exams, skin cancer screening, and weight bearing exercise. Mammogram ordered. Follow up in: 1 year.    Orders Placed This Encounter  Procedures   MM 3D DIAGNOSTIC MAMMOGRAM BILATERAL BREAST    PWD:ARAD NOT WAKE ID: [id number] PREV:08/2022 @ [facility]  NO SURGERY/ NO IMPLANTS OR REDUCTION/ NO BREAST CANCER// NO NEEDS AJ SW PT  PT AWARE $75 NO SHOW/CANCELLATION FEE WITHIN 24 HOURS SEND FOR COS[facility] 11/16/2023    Standing Status:   Future    Expiration Date:   11/15/2024    Reason for Exam (SYMPTOM  OR DIAGNOSIS REQUIRED):    BILATERAL BREAST MASSES    Is the patient pregnant?:   No    Preferred imaging [facility]?:   [facility]   US  LIMITED ULTRASOUND INCLUDING AXILLA RIGHT BREAST    PWD:ARAD NOT WAKE ID: [id number] PREV:08/2022 @ [facility] NO SURGERY/ NO IMPLANTS OR REDUCTION/ NO BREAST CANCER// NO NEEDS AJ SW PT PT AWARE $75 NO SHOW/CANCELLATION FEE WITHIN 24 HOURS SEND FOR COSIGN 11/16/2023    Standing Status:   Future    Expiration Date:   11/15/2024    Reason for Exam (SYMPTOM  OR DIAGNOSIS REQUIRED):   BILATERAL BREAST MASSES    Preferred imaging location?:   [facility]   Ambulatory referral to Internal Medicine    Referral Priority:   Routine    Referral Type:   Consultation    Referral Reason:   Specialty Services Required    Requested Specialty:   Internal Medicine    Number of Visits  Requested:   1    CARLIN RONAL CENTERS, MD, FACOG Attending Obstetrician & Gynecologist, [facility] for [facility], [facility], [place] 11/16/2023

## 2023-11-16 NOTE — Progress Notes (Signed)
 Pt presents for annual. pt has no questions or concerns at this time.

## 2023-11-18 ENCOUNTER — Telehealth: Payer: Self-pay | Admitting: Obstetrics

## 2023-11-19 ENCOUNTER — Encounter: Payer: Self-pay | Admitting: Obstetrics

## 2023-11-20 ENCOUNTER — Ambulatory Visit: Payer: Self-pay | Admitting: Obstetrics

## 2023-11-20 LAB — CYTOLOGY - PAP
Adequacy: ABNORMAL
Comment: NEGATIVE

## 2023-12-01 ENCOUNTER — Telehealth: Payer: Self-pay

## 2023-12-09 ENCOUNTER — Encounter

## 2023-12-09 ENCOUNTER — Other Ambulatory Visit

## 2023-12-10 ENCOUNTER — Ambulatory Visit
Admission: RE | Admit: 2023-12-10 | Discharge: 2023-12-10 | Disposition: A | Source: Ambulatory Visit | Attending: Obstetrics | Admitting: Obstetrics

## 2023-12-10 ENCOUNTER — Other Ambulatory Visit: Payer: Self-pay | Admitting: Obstetrics

## 2023-12-10 ENCOUNTER — Other Ambulatory Visit

## 2023-12-10 DIAGNOSIS — N6320 Unspecified lump in the left breast, unspecified quadrant: Secondary | ICD-10-CM

## 2023-12-10 DIAGNOSIS — N6315 Unspecified lump in the right breast, overlapping quadrants: Secondary | ICD-10-CM | POA: Diagnosis not present

## 2023-12-10 DIAGNOSIS — N6324 Unspecified lump in the left breast, lower inner quadrant: Secondary | ICD-10-CM | POA: Diagnosis not present

## 2023-12-10 DIAGNOSIS — R928 Other abnormal and inconclusive findings on diagnostic imaging of breast: Secondary | ICD-10-CM | POA: Diagnosis not present

## 2023-12-15 ENCOUNTER — Ambulatory Visit
Admission: RE | Admit: 2023-12-15 | Discharge: 2023-12-15 | Disposition: A | Discharge status: Home / Self Care | Source: Ambulatory Visit | Attending: Obstetrics | Admitting: Obstetrics

## 2023-12-15 DIAGNOSIS — N6320 Unspecified lump in the left breast, unspecified quadrant: Secondary | ICD-10-CM

## 2023-12-15 DIAGNOSIS — N6324 Unspecified lump in the left breast, lower inner quadrant: Secondary | ICD-10-CM | POA: Diagnosis not present

## 2023-12-15 HISTORY — PX: BREAST BIOPSY: SHX20

## 2023-12-16 LAB — SURGICAL PATHOLOGY

## 2023-12-22 ENCOUNTER — Other Ambulatory Visit

## 2023-12-24 ENCOUNTER — Encounter: Payer: Self-pay | Admitting: Obstetrics

## 2023-12-24 ENCOUNTER — Ambulatory Visit: Admitting: Obstetrics

## 2023-12-24 ENCOUNTER — Other Ambulatory Visit (HOSPITAL_COMMUNITY)
Admission: RE | Admit: 2023-12-24 | Discharge: 2023-12-24 | Disposition: A | Discharge status: Home / Self Care | Source: Ambulatory Visit | Attending: Obstetrics | Admitting: Obstetrics

## 2023-12-24 VITALS — BP 127/90 | HR 55 | Ht 64.0 in | Wt 157.7 lb

## 2023-12-24 DIAGNOSIS — R87615 Unsatisfactory cytologic smear of cervix: Secondary | ICD-10-CM | POA: Diagnosis not present

## 2023-12-24 DIAGNOSIS — N906 Unspecified hypertrophy of vulva: Secondary | ICD-10-CM

## 2023-12-24 NOTE — Progress Notes (Signed)
 Patient ID: Christina Riley, female   DOB: February 20, 1980, 43 y.o.   MRN: [id number]  Chief Complaint  Patient presents with   repeat pap    HPI Christina Riley is a 43 y.o. female.  PRESENTS FOR REPEAT PAP SMEAR DUE TO PREVIOUS PAP BEING UNSATISFACTORY FOR EVALUATION. HPI  Past Medical History:  Diagnosis Date   Anxiety    Depression    GERD (gastroesophageal reflux disease)    with pregnancy   Headache(784.0)    pregnancy   Hyperthyroidism     Past Surgical History:  Procedure Laterality Date   BREAST BIOPSY Left 12/15/2023   US  LT BREAST BX W LOC DEV 1ST LESION IMG BX SPEC US  GUIDE 12/15/2023 [facility]   CESAREAN SECTION  2008   TUBAL LIGATION     WISDOM TOOTH EXTRACTION      Family History  Problem Relation Age of Onset   Hypertension Mother    Breast cancer Mother    Hypertension Brother    Colon cancer Paternal Aunt    Diabetes Maternal Grandmother    Hypertension Maternal Grandmother    Stroke Maternal Grandmother    Heart disease Maternal Grandmother    Colon cancer Paternal Grandfather    Colon polyps Neg Hx    Esophageal cancer Neg Hx    Rectal cancer Neg Hx    Stomach cancer Neg Hx     Social History Social History   Tobacco Use   Smoking status: Former    Current packs/day: 0.00    Types: Cigarettes    Quit date: 11/14/2010    Years since quitting: 13.1   Smokeless tobacco: Never  Vaping Use   Vaping status: Never Used  Substance Use Topics   Alcohol use: Not Currently    Comment: occasional   Drug use: No    No Known Allergies  Current Outpatient Medications  Medication Sig Dispense Refill   Biotin 1 MG CAPS Take by mouth.     FLUoxetine  (PROZAC ) 20 MG capsule TAKE 1 CAPSULE(20 MG) BY MOUTH EVERY MORNING 30 capsule 11   Multiple Vitamin (MULTIVITAMIN) LIQD Take 5 mLs by mouth daily.     clobetasol  (TEMOVATE ) 0.05 % external solution Apply once a day as needed for flares (Patient not taking: Reported on 12/24/2023) 50  mL 3   No current facility-administered medications for this visit.    Review of Systems Review of Systems Constitutional: negative for fatigue and weight loss Respiratory: negative for cough and wheezing Cardiovascular: negative for chest pain, fatigue and palpitations Gastrointestinal: negative for abdominal pain and change in bowel habits Genitourinary: positive for floppy labia causing painful intercourse from entrapment Integument/breast: negative for nipple discharge Musculoskeletal:negative for myalgias Neurological: negative for gait problems and tremors Behavioral/Psych: negative for abusive relationship, depression Endocrine: negative for temperature intolerance      Blood pressure (!) 127/90, pulse (!) 55, height 5' 4 (1.626 m), weight 157 lb 11.2 oz (71.5 kg).  Physical Exam Physical Exam General:   Alert and no distress  Skin:   no rash or abnormalities  Lungs:   clear to auscultation bilaterally  Heart:   regular rate and rhythm, S1, S2 normal, no murmur, click, rub or gallop  Breasts:   Not examined  Abdomen:  normal findings: no organomegaly, soft, non-tender and no hernia  Pelvis:  External genitalia: normal general appearance Urinary system: urethral meatus normal and bladder without fullness, nontender Vaginal: normal without tenderness, induration or masses Cervix: normal appearance Adnexa: normal bimanual  exam Uterus: anteverted and non-tender, normal size     I have spent a total of 20 minutes of face-to-face time, excluding clinical staff time, reviewing notes and preparing to see patient, ordering tests and/or medications, and counseling the patient.   Data Reviewed Pap Smear  Assessment     1. Encounter for repeat Papanicolaou smear of cervix due to previous unsatisfactory results (Primary) Rx: - Cytology - PAP( [facility])  2. Labial hypertrophy Rx: - Ambulatory referral to Urogynecology     Plan   Follow up prn  Orders Placed This  Encounter  Procedures   Ambulatory referral to Urogynecology    Referral Priority:   Routine    Referral Type:   Consultation    Referral Reason:   Specialty Services Required    Requested Specialty:   Urology    Number of Visits Requested:   1    CARLIN RONAL CENTERS, MD, FACOG Attending Obstetrician & Gynecologist, [facility] for [facility], [facility], [place] 12/24/2023

## 2023-12-24 NOTE — Progress Notes (Signed)
 Pt presents for repeat pap. No questions or concerns at this time.

## 2023-12-30 LAB — CYTOLOGY - PAP
Comment: NEGATIVE
Diagnosis: NEGATIVE
High risk HPV: NEGATIVE
